# Patient Record
Sex: Male | Born: 1960 | Race: White | Hispanic: No | Marital: Single | Smoking: Never smoker
Health system: Southern US, Community
[De-identification: ages and names within clinical notes are randomized; demographics above are authoritative.]

## PROBLEM LIST (undated history)

## (undated) DIAGNOSIS — E785 Hyperlipidemia, unspecified: Secondary | ICD-10-CM

## (undated) DIAGNOSIS — T7840XA Allergy, unspecified, initial encounter: Secondary | ICD-10-CM

## (undated) DIAGNOSIS — R519 Headache, unspecified: Secondary | ICD-10-CM

## (undated) DIAGNOSIS — K219 Gastro-esophageal reflux disease without esophagitis: Secondary | ICD-10-CM

## (undated) DIAGNOSIS — R51 Headache: Secondary | ICD-10-CM

## (undated) DIAGNOSIS — G8929 Other chronic pain: Secondary | ICD-10-CM

## (undated) HISTORY — PX: APPENDECTOMY: SHX54

## (undated) HISTORY — DX: Headache, unspecified: R51.9

## (undated) HISTORY — DX: Hyperlipidemia, unspecified: E78.5

## (undated) HISTORY — DX: Gastro-esophageal reflux disease without esophagitis: K21.9

## (undated) HISTORY — DX: Headache: R51

## (undated) HISTORY — PX: FOOT SURGERY: SHX648

## (undated) HISTORY — DX: Other chronic pain: G89.29

## (undated) HISTORY — PX: ROTATOR CUFF REPAIR: SHX139

## (undated) HISTORY — DX: Allergy, unspecified, initial encounter: T78.40XA

## (undated) HISTORY — PX: OTHER SURGICAL HISTORY: SHX169

---

## 1997-08-29 ENCOUNTER — Other Ambulatory Visit: Admission: RE | Admit: 1997-08-29 | Discharge: 1997-08-29 | Payer: Self-pay | Admitting: Dermatology

## 2009-03-30 ENCOUNTER — Ambulatory Visit: Payer: Self-pay | Admitting: Internal Medicine

## 2009-03-30 DIAGNOSIS — J309 Allergic rhinitis, unspecified: Secondary | ICD-10-CM | POA: Insufficient documentation

## 2009-03-30 DIAGNOSIS — J45909 Unspecified asthma, uncomplicated: Secondary | ICD-10-CM | POA: Insufficient documentation

## 2009-03-30 LAB — CONVERTED CEMR LAB
Basophils Absolute: 0.1 10*3/uL (ref 0.0–0.1)
Eosinophils Absolute: 0.3 10*3/uL (ref 0.0–0.7)
HCT: 45.9 % (ref 39.0–52.0)
Hemoglobin: 15.4 g/dL (ref 13.0–17.0)
Lymphocytes Relative: 31.7 % (ref 12.0–46.0)
Lymphs Abs: 2.2 10*3/uL (ref 0.7–4.0)
MCHC: 33.6 g/dL (ref 30.0–36.0)
Monocytes Relative: 6.9 % (ref 3.0–12.0)
Neutro Abs: 3.9 10*3/uL (ref 1.4–7.7)
Platelets: 286 10*3/uL (ref 150.0–400.0)
RDW: 12.4 % (ref 11.5–14.6)

## 2009-04-06 DIAGNOSIS — E785 Hyperlipidemia, unspecified: Secondary | ICD-10-CM

## 2009-04-13 LAB — CONVERTED CEMR LAB: IgE (Immunoglobulin E), Serum: 11.2 intl units/mL (ref 0.0–180.0)

## 2009-04-25 ENCOUNTER — Ambulatory Visit: Payer: Self-pay | Admitting: Internal Medicine

## 2009-05-07 ENCOUNTER — Telehealth: Payer: Self-pay | Admitting: Internal Medicine

## 2009-05-16 ENCOUNTER — Ambulatory Visit: Payer: Self-pay | Admitting: Internal Medicine

## 2009-05-18 ENCOUNTER — Ambulatory Visit: Payer: Self-pay | Admitting: Internal Medicine

## 2009-05-18 ENCOUNTER — Encounter: Payer: Self-pay | Admitting: Internal Medicine

## 2009-06-13 ENCOUNTER — Ambulatory Visit: Payer: Self-pay | Admitting: Internal Medicine

## 2009-07-10 ENCOUNTER — Ambulatory Visit: Payer: Self-pay | Admitting: Internal Medicine

## 2009-10-15 ENCOUNTER — Ambulatory Visit: Payer: Self-pay | Admitting: Internal Medicine

## 2009-11-12 ENCOUNTER — Ambulatory Visit: Payer: Self-pay | Admitting: Internal Medicine

## 2010-01-24 ENCOUNTER — Ambulatory Visit: Payer: Self-pay | Admitting: Internal Medicine

## 2010-02-11 ENCOUNTER — Ambulatory Visit: Payer: Self-pay | Admitting: Internal Medicine

## 2010-04-23 NOTE — Assessment & Plan Note (Signed)
Summary: ALT - OK PER KATIE///KP   Vital Signs:  Patient profile:   50 year old male Height:      72 inches Weight:      213 pounds BMI:     28.99 O2 Sat:      99 % on Room air Pulse rate:   73 / minute BP sitting:   130 / 84  (right arm) Cuff size:   regular  Vitals Entered By: Gweneth Dimitri RN (April 25, 2009 3:54 PM)  O2 Flow:  Room air CC: Allergy Testing. Comments Medications reviewed with patient Daytime contact number verified with patient. Gweneth Dimitri RN  April 25, 2009 3:54 PM    Primary Provider/Referring Provider:  Melene Muller  CC:  Allergy Testing.Marland Kitchen  History of Present Illness: History of Present Illness: March 30, 2009- 48 yoM seen at kind request of Dr Felipa Eth with complaint of shortness of breath, especially in the last 3 months, starting in October. He had asthma until age 64, and he says it resolved for years after taking allergy shots. I saw him 15-16 years ago. He states he was athletic and had no issues then. This October is started up again without obvious cold ortriggering event. He notes chest tightness while sitting in chair. Symbicort helps. In Spring and Fall he gets rhinitis. No recent  hx of seasonal wheeze or GERD. House, no mold or smokers. Has [place] pigs- odor takes his breath if cleaning cage.  April 25, 2009- Dyspnea Asthma, allergic rhinitis Had a cold 2 weeks ago that resolved. Since then he has been off antihistamines for allergy testing today. He has felt Singulair helped only a little.  He had been out of town. Between that, placing air cleaner, and moving [place] pigs to area of house he can avoid, he is dong better.  His wife was a Engineer, civil (consulting) and gave his shots in past with no problem.. He has epipen. He lives in [place]. He is leaning toward restarting shots.  RAST was pos for dust mite and [place] pig Skin test-    Current Medications (verified): 1)  Zocor 20 Mg Tabs (Simvastatin) .... Take 1 By Mouth Once Daily 2)  Chantix 0.5  Mg Tabs (Varenicline Tartrate) .... Take As Directed 3)  Multivitamins  Tabs (Multiple Vitamin) .... Take 1 By Mouth Once Daily 4)  Advil 200 Mg Tabs (Ibuprofen) .... Take As Directed 5)  Symbicort 160-4.5 Mcg/act Aero (Budesonide-Formoterol Fumarate) .... 2 Puffs and Rinse Two Times A Day 6)  Proair Hfa 108 (90 Base) Mcg/act Aers (Albuterol Sulfate) .... 2 Puffs 4 X Daily As Needed Rescue 7)  Singulair 10 Mg Tabs (Montelukast Sodium) .Marland Kitchen.. 1 Daily  Allergies (verified): No Known Drug Allergies  Past History:  Past Medical History: Last updated: 03/30/2009 Asthma Chronic headaches Hyperlipidemia  Past Surgical History: Last updated: 03/30/2009 Appendectomy Torn ACL knee Rotator cuff repair.  Social History: Last updated: 03/30/2009 Dipped Tobacco x 30 year 'off and on". Married with 2 children. No ETOH or street drug use.  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    Physical Exam  Additional Exam:  General: A/Ox3; pleasant and cooperative, NAD, muscular SKIN: no rash, lesions NODES: no lymphadenopathy HEENT: [place]/AT, EOM- WNL, Conjuctivae-  clear, PERRLA, TM-WNL, Nose- SNIFFING, MILD CONGESTIONr, Throat- clear and wnl, Mellampatti  II NECK: Supple w/ fair ROM, JVD- none, normal carotid impulses w/o bruits Thyroid-  CHEST: clear to P&A HEART: RRR, no m/g/r heard ABDOMEN: Soft and nl; JXB:JYNW, nl pulses, no edema  NEURO: Grossly intact to observation      Impression & Recommendations:  Problem # 1:  ALLERGIC RHINITIS (ICD-477.9) Atopic, with postiive markers for dust mite/ cockroach, molds. RAST also for [place] pig. We have emphasized environmental control measures again. Chronic rhinitis  improved with allergy shots years ago, starting then with Dr Rema Jasmine. He feels strongly that he wants to restart allergy vaccine. He hopes to avoid "lots of pills", We have discussed policy, standards, safety, anaphyllaxis, Epipen, antinhistamines and environmnetal control measures. His wife is a Engineer, civil (consulting), can be with him to give his shots. He has no doctor in [place] and voices hardship at coming here to [place] to get shots. I asked him to talk this over with his wife.  Problem # 2:  ASTHMA (ICD-493.90) good control currently.  Medications Added to Medication List This Visit: 1)  Epipen 0.3 Mg/0.50ml Devi (Epinephrine) .... For severe allergic reaction  Other Orders: Est. Patient Level II (29562) Allergy Puncture Test (13086) Allergy I.D Test (57846)  Patient Instructions: 1)  Please schedule a follow-up appointment in 2 months. 2)  Let us kinow what you you decide to do about allergy vaccine- discuss with your wife. I understand you have epipen. 3)  Avoid the [place] pigs 4)  consider claritin/ loratadine as needed as an antihistamine. Prescriptions: EPIPEN 0.3 MG/0.3ML DEVI (EPINEPHRINE) For severe allergic reaction  #1 x prn   Entered and Authorized by:   Waymon Budge MD   Signed by:   Waymon Budge MD on 04/25/2009   Method used:   Print then Give to Patient   RxID:   [id number]    Immunization History:  Influenza Immunization History:    Influenza:  historical (12/22/2008)

## 2010-04-23 NOTE — Miscellaneous (Signed)
Summary: Occupational psychologist   Imported By: Lester Franklin 05/02/2009 09:13:26  _____________________________________________________________________  External Attachment:    Type:   Image     Comment:   External Document

## 2010-04-23 NOTE — Assessment & Plan Note (Signed)
Summary: full pulm. wval/childhood asthama & bronchitis/apc   Primary Provider/Referring Provider:  Melene Muller  CC:  Pulmonary Consult-Dr. Felipa Eth.  History of Present Illness: March 30, 2009- 48 yoM seen at kind request of Dr Felipa Eth with complaint of shortness of breath, especially in the last 3 months, starting in October. He had asthma until age 50, and he says it resolved for years after taking allergy shots. I saw him 15-16 years ago. He states he was athletic and had no issues then. This October is started up again without obvious cold ortriggering event. He nots chest tightness 2hile sitting in chair. Symbicort helps. In Spring and Fall he gets rhinitis. No hx of seasonal rhinitis or GERD. House, no mold or smokers. Has [place] pigs- odor takes his breath if cleaning cage.  Current Medications (verified): 1)  Zocor 20 Mg Tabs (Simvastatin) .... Take 1 By Mouth Once Daily 2)  Chantix 0.5 Mg Tabs (Varenicline Tartrate) .... Take As Directed 3)  Multivitamins  Tabs (Multiple Vitamin) .... Take 1 By Mouth Once Daily 4)  Advil 200 Mg Tabs (Ibuprofen) .... Take As Directed  Allergies (verified): No Known Drug Allergies  Past History:  Past Medical History: Asthma Chronic headaches Hyperlipidemia  Past Surgical History: Appendectomy Torn ACL knee Rotator cuff repair.  Social History: Dipped Tobacco x 30 year 'off and on". Married with 2 children. No ETOH or street drug use.  Solicitor at a Therapist, music  Review of Systems      See HPI       The patient complains of shortness of breath with activity, shortness of breath at rest, and non-productive cough.    Vital Signs:  Patient profile:   50 year old male Height:      72 inches Weight:      210.25 pounds BMI:     28.62 O2 Sat:      98 % on Room air Pulse rate:   70 / minute BP sitting:   124 / 80  (left arm) Cuff size:   regular  Vitals Entered By: Reynaldo Minium CMA (March 30, 2009 2:39 PM)  O2 Flow:  Room  air  Physical Exam  Additional Exam:  General: A/Ox3; pleasant and cooperative, NAD, muscular SKIN: no rash, lesions NODES: no lymphadenopathy HEENT: [place]/AT, EOM- WNL, Conjuctivae- clear, PERRLA, TM-WNL, Nose- clear, Throat- clear and wnl, Mellampatti  II NECK: Supple w/ fair ROM, JVD- none, normal carotid impulses w/o bruits Thyroid- normal to palpation CHEST: dry cough/ mild wheeze HEART: RRR, no m/g/r heard ABDOMEN: Soft and nl; ZSW:FUXN, nl pulses, no edema  NEURO: Grossly intact to observation      Pulmonary Function Test Date: 03/30/2009 3:35 PM Gender: Male  Pre-Spirometry FVC    Value: 4.00 L/min   % Pred: 73.50 % FEV1    Value: 2.87 L     Pred: 4.26 L     % Pred: 67.30 % FEV1/FVC  Value: 71.75 %     % Pred: 91.30 %  Impression & Recommendations:  Problem # 1:  ALLERGIC RHINITIS (ICD-477.9) Mild/ minimal symptoms now of significant rhinosiusitis.  Problem # 2:  ASTHMA (ICD-493.90) He did well for several years after allergy vaccine and is interested in looking that way again. We discussed environmental factors and suggested an air cleaner near his [place] pigs if he can't get rid of them. We will try adding samples of singulair and a rescue inhaler. We will add spirometry.  Medications Added to Medication List  This Visit: 1)  Zocor 20 Mg Tabs (Simvastatin) .... Take 1 by mouth once daily 2)  Chantix 0.5 Mg Tabs (Varenicline tartrate) .... Take as directed 3)  Multivitamins Tabs (Multiple vitamin) .... Take 1 by mouth once daily 4)  Advil 200 Mg Tabs (Ibuprofen) .... Take as directed 5)  Simcor 500-20 Mg Xr24h-tab (Niacin-simvastatin) .... Take 1 by mouth once daily 6)  Symbicort 160-4.5 Mcg/act Aero (Budesonide-formoterol fumarate) .... 2 puffs and rinse two times a day 7)  Proair Hfa 108 (90 Base) Mcg/act Aers (Albuterol sulfate) .... 2 puffs 4 x daily as needed rescue 8)  Singulair 10 Mg Tabs (Montelukast sodium) .Marland Kitchen.. 1 daily  Other Orders: Consultation Level  III (36644) TLB-CBC Platelet - w/Differential (85025-CBCD) T-"RAST" (Allergy Full Profile) IGE ([id number]) T- * Misc. Laboratory test [phone])  Patient Instructions: 1)  Return as able for allergy skin testing- Stop all antihistamines 3 days before skin testing, including cold and allergy meds, otc sleep and cough meds. 2)  Sample and script Singulair- 1 daily 3)  Sample and script Proair rescue inhaler- 2 puffs four times a day as needed 4)  Lab 5)  conisder dust control measures/ air cleaner 6)  Office spirometry Prescriptions: SYMBICORT 160-4.5 MCG/ACT AERO (BUDESONIDE-FORMOTEROL FUMARATE) 2 puffs and rinse two times a day  #1 x prn   Entered and Authorized by:   Waymon Budge MD   Signed by:   Waymon Budge MD on 03/30/2009   Method used:   Historical   RxID:   [id number] SINGULAIR 10 MG TABS (MONTELUKAST SODIUM) 1 daily  #30 x prn   Entered and Authorized by:   Waymon Budge MD   Signed by:   Waymon Budge MD on 03/30/2009   Method used:   Print then Give to Patient   RxID:   [id number] PROAIR HFA 108 (90 BASE) MCG/ACT AERS (ALBUTEROL SULFATE) 2 puffs 4 x daily as needed rescue  #1 x prn   Entered and Authorized by:   Waymon Budge MD   Signed by:   Waymon Budge MD on 03/30/2009   Method used:   Print then Give to Patient   RxID:   [id number]      CardioPerfect Spirometry  ID: [id number] Patient: David Mercado, David Mercado DOB: June 29, 1960 Age: 50 Years Old Sex: Male Race: White Physician: Arthurine Oleary,c Height: 72 Weight: 210.25 Status: Unconfirmed Recorded: 03/30/2009 3:35 PM  Parameter  Measured Predicted %Predicted FVC     4.00        5.44        73.50 FEV1     2.87        4.26        67.30 FEV1%   71.75        78.56        91.30 PEF    4.43        10.37        42.70   Interpretation:

## 2010-04-23 NOTE — Progress Notes (Signed)
Summary: allergy shots  Phone Note Call from Patient Call back at Home Phone [phone]   Caller: Patient Call For: Gilford Lardizabal Reason for Call: Talk to Nurse Summary of Call: 2 weeks since ALT - would like a call back as to when he starts his shots.      Initial call taken by: Eugene Gavia,  May 07, 2009 4:44 PM  Follow-up for Phone Call        pt has decided to start allergy vaccine. please advise. Carron Curie CMA  May 07, 2009 5:10 PM   Additional Follow-up for Phone Call Additional follow up Details #1::        We can start allergy vaccine Additional Follow-up by: Waymon Budge MD,  May 09, 2009 3:42 PM    Additional Follow-up for Phone Call Additional follow up Details #2::    We ordered the [place] pig extract yesterday. If it doesn't come in Fri. it will be Mon. before we can make up his vac.. We will call Mr.Earnhardt when it's ready. Follow-up by: Dimas Millin,  May 10, 2009 10:17 AM  Additional Follow-up for Phone Call Additional follow up Details #3:: Details for Additional Follow-up Action Taken: We need a rx for Mr. Mccurdy for his vac. Yes,the [place] pig came in.    I found the rx. Sorry to bother you.  New/Updated Medications: * ALLERGY VACCINE NEW START GO

## 2010-04-23 NOTE — Assessment & Plan Note (Signed)
Summary: 3 months/apc   Primary Provider/Referring Provider:  Melene Muller  CC:  3 month follow up and less asthma symptons states dulera inhaler has helped.  History of Present Illness:  April 25, 2009- Dyspnea Asthma, allergic rhinitis Had a cold 2 weeks ago that resolved. Since then he has been off antihistamines for allergy testing today. He has felt Singulair helped only a little.  He had been out of town. Between that, placing air cleaner, and moving [place] pigs to area of house he can avoid, he is dong better.  His wife was a Engineer, civil (consulting) and gave his shots in past with no problem.. He has epipen. He lives in [place]. He is leaning toward restarting shots.  RAST was pos for dust mite and [place] pig Skin test-   November 12, 2009- Dyspnea, Asthma, Allergic rhinitis Hard summer, needing daily use of inhalers. We discussed humidity and air quality, not allergy, as summer-time irritants .Has been building allergy vaccine with no problems. This weekend started sneeze and head congested. Taking Vicks or Theraflu to sleep. Cautioned about nasal decongestants. Symbicort didn't seem to help. He has been using rescue inhaler at least daily. Has been walking outdoors several miles daily and we talked about air quality and exposure.  February 11, 2010-  Dyspnea, Asthma, Allergic rhinitis Nurse-CC: 3 month follow up, less asthma symptons states dulera inhaler has helped.  He is pleased with Dulera 100, finding he doesn't need to take the rescue inhaler. Still has the [place] pigs. He was good till he got here and started sniffing.     Asthma History    Asthma Control Assessment:    Age range: 12+ years    Symptoms: 0-2 days/week    Nighttime Awakenings: 0-2/month    Interferes w/ normal activity: no limitations    SABA use (not for EIB): 0-2 days/week    FEV1: 2.87 liters (today)    FEV1 Pred: 4.26 liters (today)    Asthma Control Assessment: Not Well Controlled   Preventive  Screening-Counseling & Management  Alcohol-Tobacco     Smoking Status: never  Current Medications (verified): 1)  Zocor 20 Mg Tabs (Simvastatin) .... Take 1 By Mouth Once Daily 2)  Multivitamins  Tabs (Multiple Vitamin) .... Take 1 By Mouth Once Daily 3)  Advil 200 Mg Tabs (Ibuprofen) .... Take As Directed 4)  Proair Hfa 108 (90 Base) Mcg/act Aers (Albuterol Sulfate) .... 2 Puffs 4 X Daily As Needed Rescue 5)  Epipen 0.3 Mg/0.47ml Devi (Epinephrine) .... For Severe Allergic Reaction 6)  Allergy Vaccine 1:10 Go .... Next Order 7)  Dulera 100-5 Mcg/act Aero (Mometasone Furo-Formoterol Fum) .... 2 Puffs and Rinse Mouth, Two Times A Day  Allergies (verified): No Known Drug Allergies  Past History:  Past Medical History: Last updated: 03/30/2009 Asthma Chronic headaches Hyperlipidemia  Past Surgical History: Last updated: 03/30/2009 Appendectomy Torn ACL knee Rotator cuff repair.  Family History: Last updated: 11/12/2009 Parents living- no allergy or respiratory problems  Social History: Last updated: 03/30/2009 Dipped Tobacco x 30 year 'off and on". Married with 2 children. No ETOH or street drug use.  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  The patient denies shortness of breath with activity, shortness of breath at rest, productive cough, non-productive cough, coughing up blood, chest pain, irregular heartbeats, acid  heartburn, indigestion, loss of appetite, weight change, abdominal pain, difficulty swallowing, sore throat, tooth/dental problems, headaches, and sneezing.    Vital Signs:  Patient profile:   50 year old male Height:      72 inches Weight:      195.8 pounds BMI:     26.65 O2 Sat:      96 % on Room air Pulse rate:   78 / minute BP sitting:   130 / 78  (left arm)  Vitals Entered By: Renold Genta RCP, LPN (February 11, 2010 2:58 PM)  O2 Flow:  Room air CC: 3 month follow up, less asthma symptons states dulera inhaler has helped Comments Medications reviewed with patient Renold Genta RCP, LPN  February 11, 2010 3:02 PM    Physical Exam  Additional Exam:  General: A/Ox3; pleasant and cooperative, NAD, muscular SKIN: no rash, lesions NODES: no lymphadenopathy HEENT: /AT, EOM- WNL, Conjuctivae- clear, PERRLA, TM-WNL, Nose- SNIFFING repetedly. Prominent turbinates, normal mucosa. Throat- clear and wnl, Mallampati  II NECK: Supple w/ fair ROM, JVD- none, normal carotid impulses w/o bruits Thyroid-  CHEST: clear to P&A HEART: RRR, no m/g/r heard ABDOMEN: flat FAO:ZHYQ, nl pulses, no edema  NEURO: Grossly intact to observation      Pre-Spirometry FEV1    Value: 2.87 L     Pred: 4.26 L     Impression & Recommendations:  Problem # 1:  ALLERGIC RHINITIS (ICD-477.9)  Increased sniffing as he came here today. We don't recognize an exposure problem here so i suspect he reacted to the outside air leaving his car. Discussed antihistamines.  Problem # 2:  ASTHMA (ICD-493.90) Slight wheeze. We had discussed ability to reduce Dulera  a little from 2 puffs two times a day if he felt very stable,  but he may do better to stay where he is.   Other Orders: Est. Patient Level III (65784)  Patient Instructions: 1)  Please schedule a follow-up appointment in 6 months. Please call sooner if needed.

## 2010-04-23 NOTE — Miscellaneous (Signed)
Summary: Injection Record/[facility]  Injection Record/[facility]   Imported By: Lanelle Bal 07/25/2009 14:18:25  _____________________________________________________________________  External Attachment:    Type:   Image     Comment:   External Document

## 2010-04-23 NOTE — Assessment & Plan Note (Signed)
Summary: allergy shot - ok per SF//kp  Nurse Visit   Allergies: No Known Drug Allergies  Orders Added: 1)  No Charge Patient Arrived (NCPA0) [NCPA0]

## 2010-04-23 NOTE — Assessment & Plan Note (Signed)
Summary: f/u asthma//kp   Primary Jerae Izard/Referring Delton Stelle:  Melene Muller  CC:  followup, having to use inhalers everyday "hard summer" wheezing sob with exertion ans rest, and non productive cough when sob.  History of Present Illness:  History of Present Illness: History of Present Illness: March 30, 2009- 50 yoM seen at kind request of Dr Felipa Eth with complaint of shortness of breath, especially in the last 3 months, starting in October. He had asthma until age 37, and he says it resolved for years after taking allergy shots. I saw him 15-16 years ago. He states he was athletic and had no issues then. This October is started up again without obvious cold ortriggering event. He notes chest tightness while sitting in chair. Symbicort helps. In Spring and Fall he gets rhinitis. No recent  hx of seasonal wheeze or GERD. House, no mold or smokers. Has [place] pigs- odor takes his breath if cleaning cage.  April 25, 2009- Dyspnea Asthma, allergic rhinitis Had a cold 2 weeks ago that resolved. Since then he has been off antihistamines for allergy testing today. He has felt Singulair helped only a little.  He had been out of town. Between that, placing air cleaner, and moving [place] pigs to area of house he can avoid, he is dong better.  His wife was a Engineer, civil (consulting) and gave his shots in past with no problem.. He has epipen. He lives in [place]. He is leaning toward restarting shots.  RAST was pos for dust mite and [place] pig Skin test-   November 12, 2009- Dyspnea, Asthma, Allergic rhinitis Hard summer, needing daily use of inhalers. We discussed humidity and air quality, not allergy, as summer-time irritants .Has been building allergy vaccine with no problems. This weekend started sneeze and head congested. Taking Vicks or Theraflu to sleep. Cautioned about nasal decongestants. Symbicort didn't seem to help. He has been using rescue inhaler at least daily. Has been walking outdoors several miles daily  and we talked about air quality and exposure.    Asthma History    Initial Asthma Severity Rating:    Age range: 12+ years    Symptoms: >2 days/week; not daily    Nighttime Awakenings: 0-2/month    Interferes w/ normal activity: no limitations    SABA use (not for EIB): daily    Asthma Severity Assessment: Moderate Persistent   Preventive Screening-Counseling & Management  Alcohol-Tobacco     Smoking Status: never  Current Medications (verified): 1)  Zocor 20 Mg Tabs (Simvastatin) .... Take 1 By Mouth Once Daily 2)  Multivitamins  Tabs (Multiple Vitamin) .... Take 1 By Mouth Once Daily 3)  Advil 200 Mg Tabs (Ibuprofen) .... Take As Directed 4)  Proair Hfa 108 (90 Base) Mcg/act Aers (Albuterol Sulfate) .... 2 Puffs 4 X Daily As Needed Rescue 5)  Epipen 0.3 Mg/0.25ml Devi (Epinephrine) .... For Severe Allergic Reaction 6)  Allergy Vaccine New Start Go  Allergies: No Known Drug Allergies  Past History:  Past Medical History: Last updated: 03/30/2009 Asthma Chronic headaches Hyperlipidemia  Past Surgical History: Last updated: 03/30/2009 Appendectomy Torn ACL knee Rotator cuff repair.  Family History: Last updated: 11/12/2009 Parents living- no allergy or respiratory problems  Social History: Last updated: 03/30/2009 Dipped Tobacco x 30 year 'off and on". Married with 2 children. No ETOH or street drug use.  Solicitor at a box [employer]  Risk Factors: Smoking Status: never (11/12/2009)  Family History: Parents living- no allergy or respiratory problems  Social History:  Smoking Status:  never  Review of Systems      See HPI       The patient complains of nasal congestion/difficulty breathing through nose and sneezing.  The patient denies shortness of breath with activity, shortness of breath at rest, productive cough, non-productive cough, coughing up blood, chest pain, irregular heartbeats, acid heartburn, indigestion, loss of appetite, weight change,  abdominal pain, difficulty swallowing, sore throat, tooth/dental problems, and headaches.    Vital Signs:  Patient profile:   50 year old male Height:      72 inches Weight:      194.13 pounds BMI:     26.42 O2 Sat:      98 % on Room air BP sitting:   140 / 70  (left arm) Cuff size:   regular  Vitals Entered By: Kandice Hams CMA (November 12, 2009 2:57 PM)  O2 Flow:  Room air CC: followup, having to use inhalers everyday "hard summer" wheezing sob with exertion ans rest, non productive cough when sob Comments pt stopped singulair and symbicort per pt did not seem to help.   Physical Exam  Additional Exam:  General: A/Ox3; pleasant and cooperative, NAD, muscular SKIN: no rash, lesions NODES: no lymphadenopathy HEENT: [place]/AT, EOM- WNL, Conjuctivae- clear, PERRLA, TM-WNL, Nose- SNIFFING repetedly. Prominent turbinates, normal mucosa. Throat- clear and wnl, Mallampati  II NECK: Supple w/ fair ROM, JVD- none, normal carotid impulses w/o bruits Thyroid-  CHEST: clear to P&A HEART: RRR, no m/g/r heard ABDOMEN: flat WGN:FAOZ, nl pulses, no edema  NEURO: Grossly intact to observation      Impression & Recommendations:  Problem # 1:  ALLERGIC RHINITIS (ICD-477.9)  We wil have him try again with a nasal steroid. Advancing allergy vaccine to 1:10. Also try nasal strips at night, Mucosa doesn't ;ook bad and he may eventually want to talk to an ENT surgeon about mechanical airway improvement.  Problem # 2:  ASTHMA (ICD-493.90) He didn't find Symbicort helpfu.l I think if his nose worked better he would feel less dyspneic, but I don't find any process other than asthma and rhinitis  to give dyspnea. We will try [facility].  Medications Added to Medication List This Visit: 1)  Allergy Vaccine 1:10 Go  .... Next order 2)  Dulera 100-5 Mcg/act Aero (Mometasone furo-formoterol fum) .... 2 puffs and rinse mouth, two times a day  Other Orders: Est. Patient Level IV (30865)  Patient  Instructions: 1)  We will have the allergy lab increase your vaccine to the 1:10 strength next time you order. 2)  You can go up to twice weekly with your current vaccine. 3)  Try Breathe right Nasal strips to open your nose at night 4)  Try Dulera 100-5, sample and script, as a maintenance inhaler 5)  2 puffs and rinse mouth, twice every day 6)  Sample Veramyst, 1-2 puffs each nostril once daily at bedtime. You can use it twice daily if needed. Call for script if helpful. Prescriptions: DULERA 100-5 MCG/ACT AERO (MOMETASONE FURO-FORMOTEROL FUM) 2 puffs and rinse mouth, two times a day  #1 x prn   Entered and Authorized by:   Waymon Budge MD   Signed by:   Waymon Budge MD on 11/12/2009   Method used:   Print then Give to Patient   RxID:   [id number]

## 2010-04-23 NOTE — Miscellaneous (Signed)
Summary: Injection Orders / [facility]    Injection Orders / [facility]    Imported By: Lennie Odor 08/21/2009 14:30:45  _____________________________________________________________________  External Attachment:    Type:   Image     Comment:   External Document

## 2010-07-24 ENCOUNTER — Other Ambulatory Visit: Payer: Self-pay | Admitting: Internal Medicine

## 2010-07-26 ENCOUNTER — Telehealth: Payer: Self-pay | Admitting: Internal Medicine

## 2010-07-26 NOTE — Telephone Encounter (Signed)
Spoke with pharmacist and advised okay to refill allergy syringes #100 with 1 refill and advised to have pt keep his appt for future refills.

## 2010-08-06 ENCOUNTER — Ambulatory Visit (INDEPENDENT_AMBULATORY_CARE_PROVIDER_SITE_OTHER): Payer: Self-pay

## 2010-08-06 DIAGNOSIS — J309 Allergic rhinitis, unspecified: Secondary | ICD-10-CM

## 2010-08-12 ENCOUNTER — Encounter: Payer: Self-pay | Admitting: Internal Medicine

## 2010-08-12 ENCOUNTER — Ambulatory Visit (INDEPENDENT_AMBULATORY_CARE_PROVIDER_SITE_OTHER): Payer: 59 | Admitting: Internal Medicine

## 2010-08-12 VITALS — BP 114/70 | HR 65 | Ht 72.0 in | Wt 200.2 lb

## 2010-08-12 DIAGNOSIS — F17200 Nicotine dependence, unspecified, uncomplicated: Secondary | ICD-10-CM

## 2010-08-12 DIAGNOSIS — Z720 Tobacco use: Secondary | ICD-10-CM | POA: Insufficient documentation

## 2010-08-12 DIAGNOSIS — J309 Allergic rhinitis, unspecified: Secondary | ICD-10-CM

## 2010-08-12 DIAGNOSIS — J45909 Unspecified asthma, uncomplicated: Secondary | ICD-10-CM

## 2010-08-12 MED ORDER — VARENICLINE TARTRATE 0.5 MG PO TABS: ORAL_TABLET | ORAL | Status: AC

## 2010-08-12 MED ORDER — EPINEPHRINE 0.3 MG/0.3ML IJ DEVI: 0.3000 mg | Freq: Once | INTRAMUSCULAR | Status: AC

## 2010-08-12 MED ORDER — VARENICLINE TARTRATE 1 MG PO TABS: ORAL_TABLET | ORAL | Status: AC

## 2010-08-12 NOTE — Assessment & Plan Note (Addendum)
Snuff dipper. We discussed and will restart Chantix. We discussed especially the concerns related to side effects including mood/ depression.

## 2010-08-12 NOTE — Progress Notes (Signed)
  Subjective:    Patient ID: David Mercado, male    DOB: 09-06-1960, 50 y.o.   MRN: [id number]  HPI 08/11/10- 50 yoM followed for allergic rhinitis and asthma. Last here February 11, 2010. Reports no special problems since last here. His wife still gives his allergy shots which he thinks do help. Still has the [place] pigs to which he is allergic- now has air cleaners for that area with benefit. He denies need for changes. He uses Dulera for a few days at a time if at all tight, with no need for his rescue inhaler.  He asks to restart Chantix which helped in the past to stop dipping. Well tolerated before with a few dreams. He has pamphlet.    Review of Systems Constitutional:   No weight loss, night sweats,  Fevers, chills, fatigue, lassitude. HEENT:   No headaches,  Difficulty swallowing,  Tooth/dental problems,  Sore throat,                No sneezing, itching, ear ache, nasal congestion, post nasal drip,   CV:  No chest pain,  Orthopnea, PND, swelling in lower extremities, anasarca, dizziness, palpitations  GI  No heartburn, indigestion, abdominal pain, nausea, vomiting, diarrhea, change in bowel habits, loss of appetite  Resp: No shortness of breath with exertion or at rest.  No excess mucus, no productive cough,  No non-productive cough,  No coughing up of blood.  No change in color of mucus. Occasional mild wheezing per HPI.    Skin: no rash or lesions.  GU: no dysuria, change in color of urine, no urgency or frequency.  No flank pain.  MS:  No joint pain or swelling.  No decreased range of motion.  No back pain.  Psych:  No change in mood or affect. No depression or anxiety.  No memory loss.      Objective:   Physical Exam General- Alert, Oriented, Affect-appropriate, Distress- none acute  Skin- rash-none, lesions- none, excoriation- none  Lymphadenopathy- none  Head- atraumatic  Eyes- Gross vision intact, PERRLA, conjunctivae clear secretions  Ears- Hearing, canals, -  normal , Nose- Mild edema and mucus bridging,    No- Septal dev , polyps, erosion, perforation   Throat- Mallampati II , mucosa clear , drainage- none, tonsils- atrophic  Neck- flexible , trachea midline, no stridor , thyroid nl, carotid no bruit  Chest - symmetrical excursion , unlabored     Heart/CV- RRR , no murmur , no gallop  , no rub, nl s1 s2                     - JVD- none , edema- none, stasis changes- none, varices- none     Lung- clear to P&A, wheeze- none, cough- none , dullness-none, rub- none     Chest wall-   Abd- tender-no, distended-no, bowel sounds-present, HSM- no  Br/ Gen/ Rectal- Not done, not indicated  Extrem- cyanosis- none, clubbing, none, atrophy- none, strength- nl  Neuro- grossly intact to observation         Assessment & Plan:

## 2010-08-12 NOTE — Patient Instructions (Signed)
Continue allergy vaccine  Epipen refill printed  Scripts for Chantix starter and continuity printed.   Please call as needed.

## 2010-08-12 NOTE — Assessment & Plan Note (Signed)
To continue allergy shots. He has not gotten rid of the [place] pigs although he knows he is allergic to them. Discussed and refilled his Epipen.

## 2010-08-12 NOTE — Assessment & Plan Note (Signed)
Good control

## 2010-08-15 ENCOUNTER — Encounter: Payer: Self-pay | Admitting: Internal Medicine

## 2010-12-27 ENCOUNTER — Telehealth: Payer: Self-pay | Admitting: Internal Medicine

## 2010-12-27 MED ORDER — MOMETASONE FURO-FORMOTEROL FUM 100-5 MCG/ACT IN AERO: 2.0000 | INHALATION_SPRAY | Freq: Two times a day (BID) | RESPIRATORY_TRACT | Status: DC

## 2010-12-27 NOTE — Telephone Encounter (Signed)
I spoke with mrs. David Mercado and she states pt needed his dulera rx sent to [facility] for 3 month supply. Rx has been sent

## 2010-12-30 ENCOUNTER — Telehealth: Payer: Self-pay | Admitting: Internal Medicine

## 2010-12-30 NOTE — Telephone Encounter (Signed)
I spoke with pt wife and she states she gave pt his allergy shot last night and he had a severe allergic reaction. She states he is on a maintenance dose but they just received a new bottle on Friday. Pt wife states he was given the allergy vaccine and then 10-15 minutes later he started sweating profusely, was very hot, flush, felt faint. She states she gave pt 2 benadryl and gave him a shot of epinephrine. She states pt was still not completely himself so she called 911. She states while on the phone with them she states pt came to and states he did not want to go to the hospital. Wife states pt looked and felt fine this morning and went to work. She states he did have a little headache but read the epinephrine can cause that. Wife is wanting to know if the new bottle could have caused this or something else. Please advise Dr. Maple Hudson. Thanks  Carver Fila, CMA

## 2010-12-30 NOTE — Telephone Encounter (Signed)
I returned call. Wife put patient on phone. He still feels "washed out" and had come home from work. Arm is still sore at injection site from shot last night. We discussed use of nonsedating antihistamine now, rather than benadryl, if needed. He had pruritus, flushed, hot. Imp- Allergic response to first dose of vaccine from new vial- still at 1:50 as he has been using. Shots were for allergic asthma.  Plan- D/C allergy vaccine now. Call if needed next few days. Otherwise he will keep scheduled appointment as we watch to see over time how he does off shots.

## 2010-12-31 ENCOUNTER — Other Ambulatory Visit: Payer: Self-pay | Admitting: Internal Medicine

## 2012-01-09 ENCOUNTER — Ambulatory Visit
Admission: RE | Admit: 2012-01-09 | Discharge: 2012-01-09 | Disposition: A | Discharge status: Home / Self Care | Payer: 59 | Source: Ambulatory Visit | Attending: Allergy | Admitting: Allergy

## 2012-01-09 ENCOUNTER — Other Ambulatory Visit: Payer: Self-pay | Admitting: Allergy

## 2012-01-09 DIAGNOSIS — J45909 Unspecified asthma, uncomplicated: Secondary | ICD-10-CM

## 2012-10-22 ENCOUNTER — Encounter: Payer: Self-pay | Admitting: Internal Medicine

## 2012-12-14 ENCOUNTER — Ambulatory Visit (AMBULATORY_SURGERY_CENTER): Payer: Self-pay | Admitting: *Deleted

## 2012-12-14 VITALS — Ht 72.0 in | Wt 207.0 lb

## 2012-12-14 DIAGNOSIS — Z1211 Encounter for screening for malignant neoplasm of colon: Secondary | ICD-10-CM

## 2012-12-14 MED ORDER — PEG-KCL-NACL-NASULF-NA ASC-C 100 G PO SOLR: ORAL | Status: DC

## 2012-12-14 NOTE — Progress Notes (Signed)
Pt states he was told by his mother "I had a polyp removed as a child"  No egg or soy allergy

## 2012-12-15 ENCOUNTER — Encounter: Payer: Self-pay | Admitting: Internal Medicine

## 2012-12-28 ENCOUNTER — Encounter: Payer: Self-pay | Admitting: Internal Medicine

## 2012-12-28 ENCOUNTER — Ambulatory Visit (AMBULATORY_SURGERY_CENTER): Payer: 59 | Admitting: Internal Medicine

## 2012-12-28 VITALS — BP 136/74 | HR 51 | Temp 96.3°F | Resp 13 | Ht 72.0 in | Wt 207.0 lb

## 2012-12-28 DIAGNOSIS — D126 Benign neoplasm of colon, unspecified: Secondary | ICD-10-CM

## 2012-12-28 DIAGNOSIS — Z1211 Encounter for screening for malignant neoplasm of colon: Secondary | ICD-10-CM

## 2012-12-28 MED ORDER — SODIUM CHLORIDE 0.9 % IV SOLN: 500.0000 mL | INTRAVENOUS | Status: DC

## 2012-12-28 NOTE — Progress Notes (Signed)
Called to room to assist during endoscopic procedure.  Patient ID and intended procedure confirmed with present staff. Received instructions for my participation in the procedure from the performing physician.  

## 2012-12-28 NOTE — Op Note (Signed)
[facility] [address] [place], [address]   COLONOSCOPY PROCEDURE REPORT  PATIENT: David Mercado, David Mercado  MR#: [id number] BIRTHDATE: 1960/10/24 , 52  yrs. old GENDER: Male ENDOSCOPIST: Roxy Cedar, MD REFERRED XB:[id number] Avva, M.D. PROCEDURE DATE:  12/28/2012 PROCEDURE:   Colonoscopy with snare polypectomy x 1 First Screening Colonoscopy - Avg.  risk and is 50 yrs.  old or older Yes.  Prior Negative Screening - Now for repeat screening. N/A  History of Adenoma - Now for follow-up colonoscopy & has been > or = to 3 yrs.  N/A  Polyps Removed Today? Yes. ASA CLASS:   Class II INDICATIONS:average risk screening. MEDICATIONS: MAC sedation, administered by CRNA and propofol (Diprivan) 300mg  IV  DESCRIPTION OF PROCEDURE:   After the risks benefits and alternatives of the procedure were thoroughly explained, informed consent was obtained.  A digital rectal exam revealed no abnormalities of the rectum.   The LB [id number]  endoscope was introduced through the anus and advanced to the cecum, which was identified by both the appendix and ileocecal valve. No adverse events experienced.   The quality of the prep was excellent, using MoviPrep  The instrument was then slowly withdrawn as the colon was fully examined.   COLON FINDINGS: A diminutive polyp was found at the cecum.  A polypectomy was performed with a cold snare.  The resection was complete and the polyp tissue was completely retrieved.   The colon was otherwise normal.  There was no diverticulosis, inflammation, other polyps or cancers .  Retroflexed views revealed internal hemorrhoids. The time to cecum=2 minutes 50 seconds.  Withdrawal time=11 minutes 37 seconds.  The scope was withdrawn and the procedure completed.  COMPLICATIONS: There were no complications.  ENDOSCOPIC IMPRESSION: 1.   Diminutive polyp was found at the cecum; polypectomy was performed with a cold snare 2.   The colon was  otherwise normal  RECOMMENDATIONS: 1. Repeat colonoscopy in 5 years if polyp adenomatous; otherwise 10 years   eSigned:  Roxy Cedar, MD 12/28/2012 11:59 AM   cc: Chilton Greathouse, MD and The Patient

## 2012-12-28 NOTE — Progress Notes (Signed)
Lidocaine-40mg IV prior to Propofol InductionPropofol given over incremental dosages 

## 2012-12-28 NOTE — Progress Notes (Signed)
Patient did not experience any of the following events: a burn prior to discharge; a fall within the facility; wrong site/side/patient/procedure/implant event; or a hospital transfer or hospital admission upon discharge from the facility. (G8907) Patient did not have preoperative order for IV antibiotic SSI prophylaxis. (G8918)  

## 2012-12-28 NOTE — Patient Instructions (Addendum)

## 2012-12-29 ENCOUNTER — Telehealth: Payer: Self-pay | Admitting: *Deleted

## 2012-12-29 NOTE — Telephone Encounter (Signed)
  Follow up Call-  Call back number 12/28/2012  Post procedure Call Back phone  # [phone]  Permission to leave phone message Yes     Patient questions:  Do you have a fever, pain , or abdominal swelling? no Pain Score  0 *  Have you tolerated food without any problems? yes  Have you been able to return to your normal activities? yes  Do you have any questions about your discharge instructions: Diet   no Medications  no Follow up visit  no  Do you have questions or concerns about your Care? no  Actions: * If pain score is 4 or above: No action needed, pain <4.

## 2013-01-04 ENCOUNTER — Encounter: Payer: Self-pay | Admitting: Internal Medicine

## 2013-04-08 IMAGING — CR DG CHEST 2V
2 series · 2 of 2 positions shown · non-contrast
Comparison: None.

CLINICAL DATA: Asthma.  Wheezing and coughing.

CHEST - 2 VIEW

[view not recorded (1 of 2)]
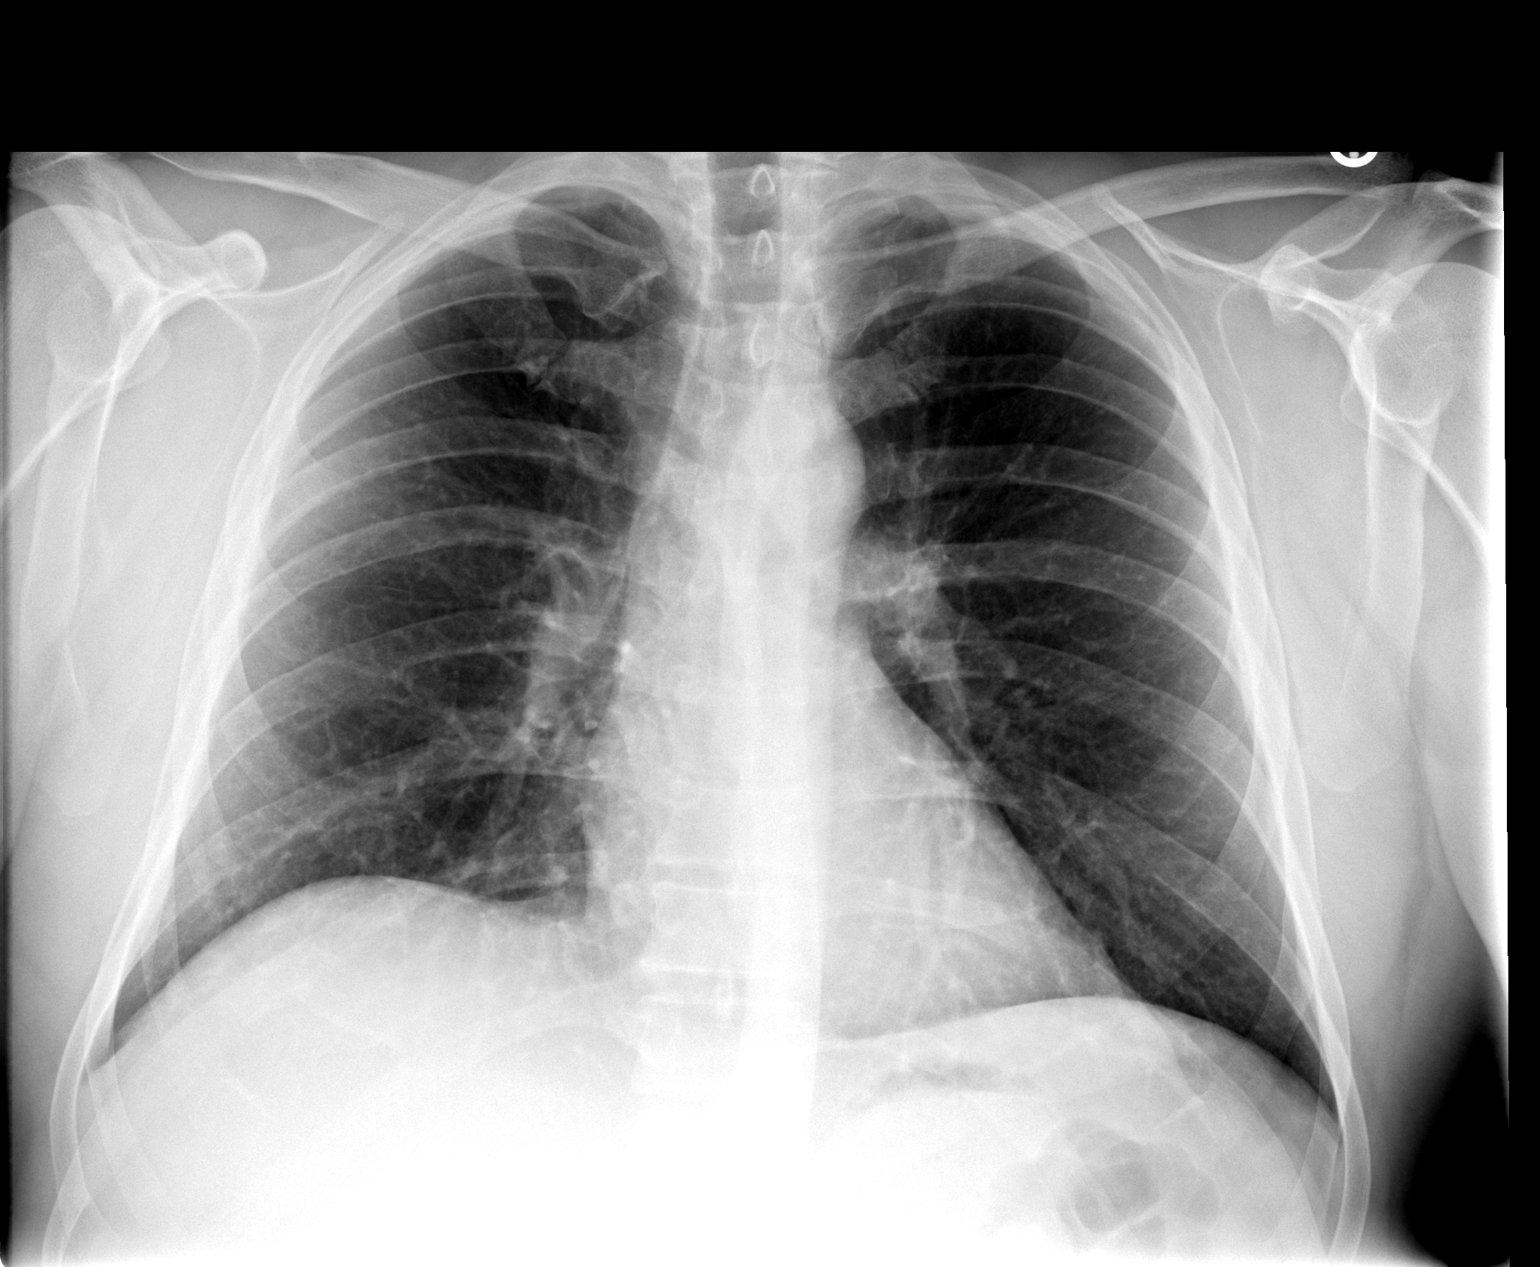

[view not recorded (2 of 2)]
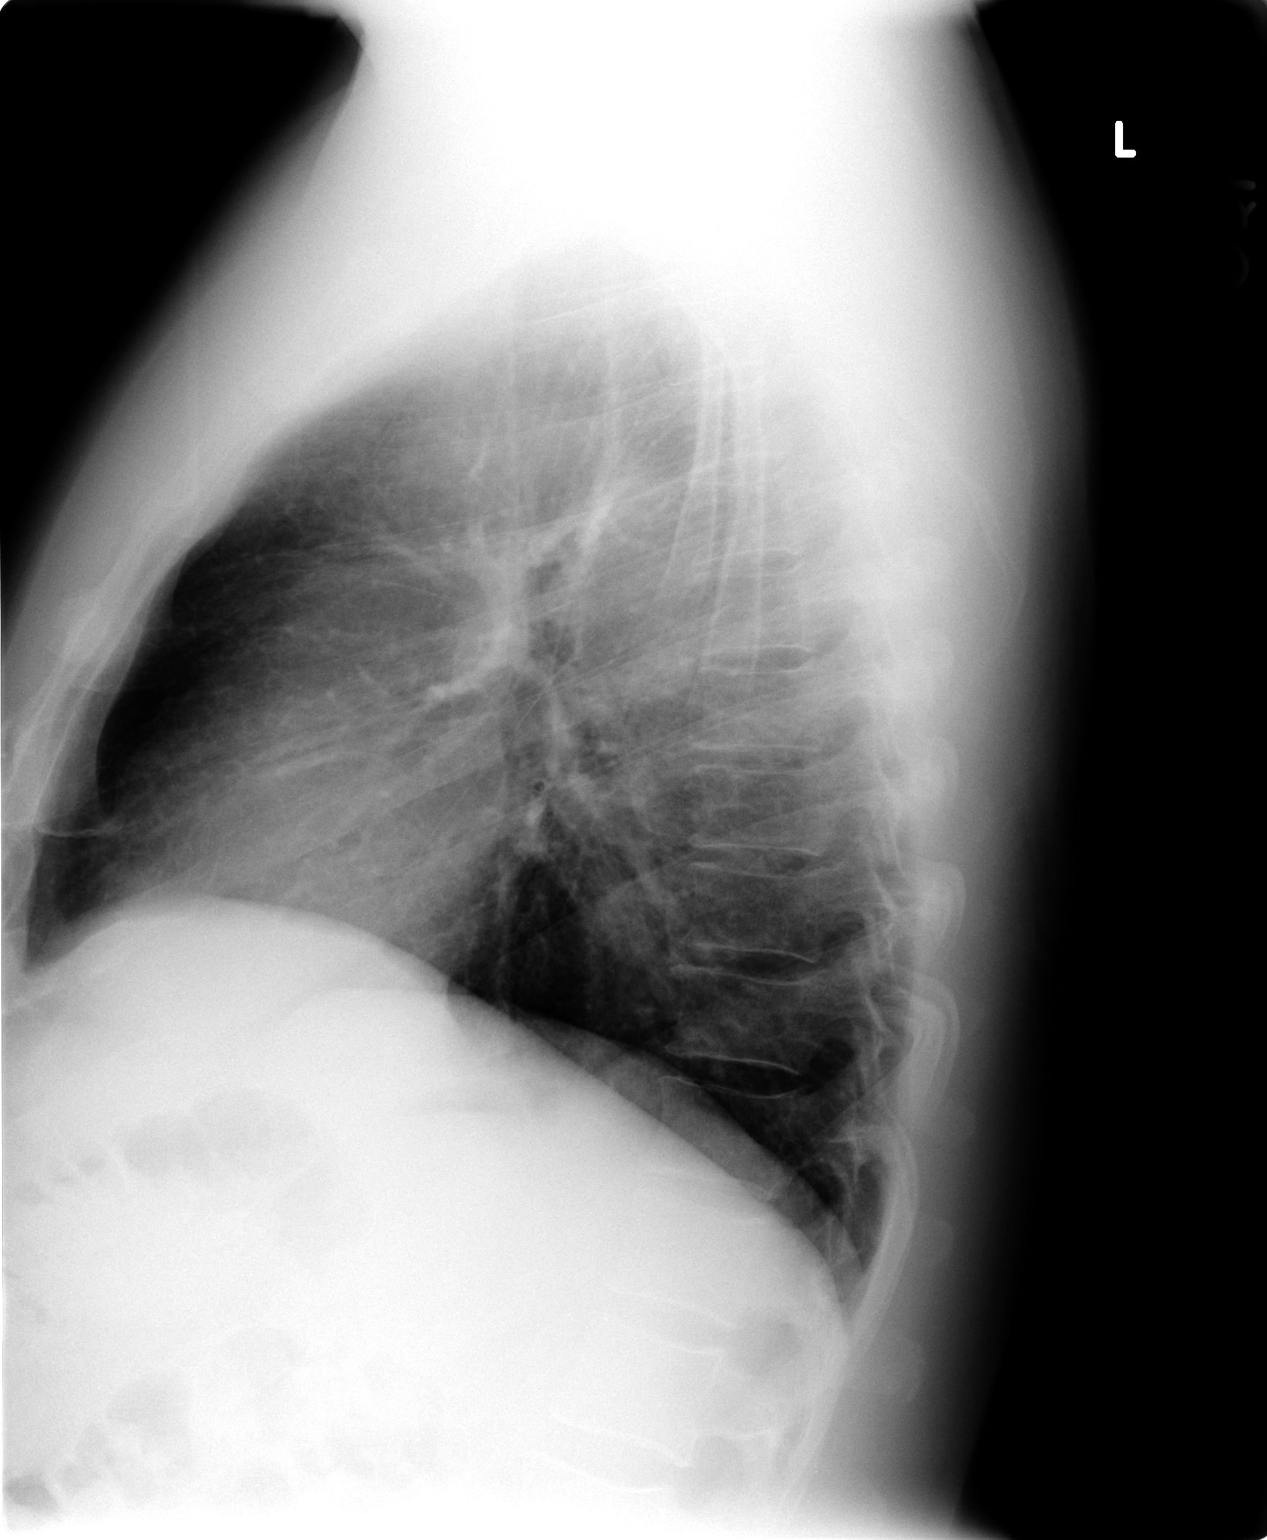

[2 of 2 positions shown; findings below may reference images not displayed]

FINDINGS: Cardiac and mediastinal contours appear normal.

The lungs appear clear.

No pleural effusion is identified.
IMPRESSION: No significant abnormality identified.

## 2014-01-25 ENCOUNTER — Encounter: Payer: Self-pay | Admitting: Internal Medicine

## 2016-03-26 DIAGNOSIS — J3089 Other allergic rhinitis: Secondary | ICD-10-CM | POA: Diagnosis not present

## 2016-04-04 DIAGNOSIS — J3089 Other allergic rhinitis: Secondary | ICD-10-CM | POA: Diagnosis not present

## 2016-04-15 DIAGNOSIS — J3089 Other allergic rhinitis: Secondary | ICD-10-CM | POA: Diagnosis not present

## 2016-04-28 DIAGNOSIS — J3089 Other allergic rhinitis: Secondary | ICD-10-CM | POA: Diagnosis not present

## 2016-05-08 DIAGNOSIS — J3089 Other allergic rhinitis: Secondary | ICD-10-CM | POA: Diagnosis not present

## 2016-05-13 DIAGNOSIS — J3089 Other allergic rhinitis: Secondary | ICD-10-CM | POA: Diagnosis not present

## 2016-05-15 DIAGNOSIS — J3089 Other allergic rhinitis: Secondary | ICD-10-CM | POA: Diagnosis not present

## 2016-05-21 DIAGNOSIS — J3089 Other allergic rhinitis: Secondary | ICD-10-CM | POA: Diagnosis not present

## 2016-05-28 DIAGNOSIS — J3089 Other allergic rhinitis: Secondary | ICD-10-CM | POA: Diagnosis not present

## 2016-05-28 DIAGNOSIS — J301 Allergic rhinitis due to pollen: Secondary | ICD-10-CM | POA: Diagnosis not present

## 2016-06-06 DIAGNOSIS — J3089 Other allergic rhinitis: Secondary | ICD-10-CM | POA: Diagnosis not present

## 2016-06-16 DIAGNOSIS — J3089 Other allergic rhinitis: Secondary | ICD-10-CM | POA: Diagnosis not present

## 2016-06-30 DIAGNOSIS — J3089 Other allergic rhinitis: Secondary | ICD-10-CM | POA: Diagnosis not present

## 2016-07-11 DIAGNOSIS — J3089 Other allergic rhinitis: Secondary | ICD-10-CM | POA: Diagnosis not present

## 2016-07-21 DIAGNOSIS — R509 Fever, unspecified: Secondary | ICD-10-CM | POA: Diagnosis not present

## 2016-07-21 DIAGNOSIS — J209 Acute bronchitis, unspecified: Secondary | ICD-10-CM | POA: Diagnosis not present

## 2016-07-21 DIAGNOSIS — R05 Cough: Secondary | ICD-10-CM | POA: Diagnosis not present

## 2016-07-23 DIAGNOSIS — J3089 Other allergic rhinitis: Secondary | ICD-10-CM | POA: Diagnosis not present

## 2016-08-05 DIAGNOSIS — J3089 Other allergic rhinitis: Secondary | ICD-10-CM | POA: Diagnosis not present

## 2016-08-19 DIAGNOSIS — J3089 Other allergic rhinitis: Secondary | ICD-10-CM | POA: Diagnosis not present

## 2016-08-29 DIAGNOSIS — J3089 Other allergic rhinitis: Secondary | ICD-10-CM | POA: Diagnosis not present

## 2016-09-08 DIAGNOSIS — J3089 Other allergic rhinitis: Secondary | ICD-10-CM | POA: Diagnosis not present

## 2016-09-16 DIAGNOSIS — J3089 Other allergic rhinitis: Secondary | ICD-10-CM | POA: Diagnosis not present

## 2016-09-30 DIAGNOSIS — J3089 Other allergic rhinitis: Secondary | ICD-10-CM | POA: Diagnosis not present

## 2016-10-10 DIAGNOSIS — J3089 Other allergic rhinitis: Secondary | ICD-10-CM | POA: Diagnosis not present

## 2016-10-10 DIAGNOSIS — J4520 Mild intermittent asthma, uncomplicated: Secondary | ICD-10-CM | POA: Diagnosis not present

## 2016-10-16 DIAGNOSIS — J3089 Other allergic rhinitis: Secondary | ICD-10-CM | POA: Diagnosis not present

## 2016-10-29 DIAGNOSIS — J3089 Other allergic rhinitis: Secondary | ICD-10-CM | POA: Diagnosis not present

## 2016-11-11 DIAGNOSIS — J3089 Other allergic rhinitis: Secondary | ICD-10-CM | POA: Diagnosis not present

## 2016-11-25 DIAGNOSIS — J3089 Other allergic rhinitis: Secondary | ICD-10-CM | POA: Diagnosis not present

## 2016-11-28 DIAGNOSIS — J3089 Other allergic rhinitis: Secondary | ICD-10-CM | POA: Diagnosis not present

## 2016-12-08 DIAGNOSIS — J3089 Other allergic rhinitis: Secondary | ICD-10-CM | POA: Diagnosis not present

## 2016-12-19 DIAGNOSIS — J3089 Other allergic rhinitis: Secondary | ICD-10-CM | POA: Diagnosis not present

## 2016-12-27 DIAGNOSIS — Z23 Encounter for immunization: Secondary | ICD-10-CM | POA: Diagnosis not present

## 2017-01-01 DIAGNOSIS — J3089 Other allergic rhinitis: Secondary | ICD-10-CM | POA: Diagnosis not present

## 2017-01-12 DIAGNOSIS — J3089 Other allergic rhinitis: Secondary | ICD-10-CM | POA: Diagnosis not present

## 2017-01-16 DIAGNOSIS — J301 Allergic rhinitis due to pollen: Secondary | ICD-10-CM | POA: Diagnosis not present

## 2017-01-16 DIAGNOSIS — J3089 Other allergic rhinitis: Secondary | ICD-10-CM | POA: Diagnosis not present

## 2017-01-19 DIAGNOSIS — Z0000 Encounter for general adult medical examination without abnormal findings: Secondary | ICD-10-CM | POA: Diagnosis not present

## 2017-01-21 DIAGNOSIS — J3089 Other allergic rhinitis: Secondary | ICD-10-CM | POA: Diagnosis not present

## 2017-01-26 DIAGNOSIS — Z0000 Encounter for general adult medical examination without abnormal findings: Secondary | ICD-10-CM | POA: Diagnosis not present

## 2017-01-26 DIAGNOSIS — M1990 Unspecified osteoarthritis, unspecified site: Secondary | ICD-10-CM | POA: Diagnosis not present

## 2017-01-26 DIAGNOSIS — E7849 Other hyperlipidemia: Secondary | ICD-10-CM | POA: Diagnosis not present

## 2017-01-26 DIAGNOSIS — K635 Polyp of colon: Secondary | ICD-10-CM | POA: Diagnosis not present

## 2017-01-26 DIAGNOSIS — J3089 Other allergic rhinitis: Secondary | ICD-10-CM | POA: Diagnosis not present

## 2017-01-28 DIAGNOSIS — Z1212 Encounter for screening for malignant neoplasm of rectum: Secondary | ICD-10-CM | POA: Diagnosis not present

## 2017-01-29 DIAGNOSIS — M7582 Other shoulder lesions, left shoulder: Secondary | ICD-10-CM | POA: Diagnosis not present

## 2017-01-29 DIAGNOSIS — M542 Cervicalgia: Secondary | ICD-10-CM | POA: Diagnosis not present

## 2017-01-29 DIAGNOSIS — M25522 Pain in left elbow: Secondary | ICD-10-CM | POA: Diagnosis not present

## 2017-02-02 DIAGNOSIS — J3089 Other allergic rhinitis: Secondary | ICD-10-CM | POA: Diagnosis not present

## 2017-02-10 DIAGNOSIS — J3089 Other allergic rhinitis: Secondary | ICD-10-CM | POA: Diagnosis not present

## 2017-02-18 DIAGNOSIS — J3089 Other allergic rhinitis: Secondary | ICD-10-CM | POA: Diagnosis not present

## 2017-03-04 DIAGNOSIS — J3089 Other allergic rhinitis: Secondary | ICD-10-CM | POA: Diagnosis not present

## 2017-03-09 DIAGNOSIS — M25512 Pain in left shoulder: Secondary | ICD-10-CM | POA: Diagnosis not present

## 2017-03-11 DIAGNOSIS — M25512 Pain in left shoulder: Secondary | ICD-10-CM | POA: Diagnosis not present

## 2017-03-13 DIAGNOSIS — J3089 Other allergic rhinitis: Secondary | ICD-10-CM | POA: Diagnosis not present

## 2017-03-25 DIAGNOSIS — J3089 Other allergic rhinitis: Secondary | ICD-10-CM | POA: Diagnosis not present

## 2017-04-08 DIAGNOSIS — J3089 Other allergic rhinitis: Secondary | ICD-10-CM | POA: Diagnosis not present

## 2017-04-15 DIAGNOSIS — G8918 Other acute postprocedural pain: Secondary | ICD-10-CM | POA: Diagnosis not present

## 2017-04-15 DIAGNOSIS — M24112 Other articular cartilage disorders, left shoulder: Secondary | ICD-10-CM | POA: Diagnosis not present

## 2017-04-15 DIAGNOSIS — M94212 Chondromalacia, left shoulder: Secondary | ICD-10-CM | POA: Diagnosis not present

## 2017-04-15 DIAGNOSIS — S43492A Other sprain of left shoulder joint, initial encounter: Secondary | ICD-10-CM | POA: Diagnosis not present

## 2017-04-17 DIAGNOSIS — M6281 Muscle weakness (generalized): Secondary | ICD-10-CM | POA: Diagnosis not present

## 2017-04-17 DIAGNOSIS — M25512 Pain in left shoulder: Secondary | ICD-10-CM | POA: Diagnosis not present

## 2017-04-21 DIAGNOSIS — M6281 Muscle weakness (generalized): Secondary | ICD-10-CM | POA: Diagnosis not present

## 2017-04-21 DIAGNOSIS — M25512 Pain in left shoulder: Secondary | ICD-10-CM | POA: Diagnosis not present

## 2017-04-23 DIAGNOSIS — J3089 Other allergic rhinitis: Secondary | ICD-10-CM | POA: Diagnosis not present

## 2017-04-24 DIAGNOSIS — M25512 Pain in left shoulder: Secondary | ICD-10-CM | POA: Diagnosis not present

## 2017-04-24 DIAGNOSIS — M6281 Muscle weakness (generalized): Secondary | ICD-10-CM | POA: Diagnosis not present

## 2017-04-27 DIAGNOSIS — M25512 Pain in left shoulder: Secondary | ICD-10-CM | POA: Diagnosis not present

## 2017-04-27 DIAGNOSIS — M6281 Muscle weakness (generalized): Secondary | ICD-10-CM | POA: Diagnosis not present

## 2017-04-29 DIAGNOSIS — M25512 Pain in left shoulder: Secondary | ICD-10-CM | POA: Diagnosis not present

## 2017-04-29 DIAGNOSIS — M6281 Muscle weakness (generalized): Secondary | ICD-10-CM | POA: Diagnosis not present

## 2017-05-04 DIAGNOSIS — M25512 Pain in left shoulder: Secondary | ICD-10-CM | POA: Diagnosis not present

## 2017-05-04 DIAGNOSIS — M6281 Muscle weakness (generalized): Secondary | ICD-10-CM | POA: Diagnosis not present

## 2017-05-04 DIAGNOSIS — J3089 Other allergic rhinitis: Secondary | ICD-10-CM | POA: Diagnosis not present

## 2017-05-07 DIAGNOSIS — M25512 Pain in left shoulder: Secondary | ICD-10-CM | POA: Diagnosis not present

## 2017-05-07 DIAGNOSIS — M6281 Muscle weakness (generalized): Secondary | ICD-10-CM | POA: Diagnosis not present

## 2017-05-11 DIAGNOSIS — M6281 Muscle weakness (generalized): Secondary | ICD-10-CM | POA: Diagnosis not present

## 2017-05-11 DIAGNOSIS — M25512 Pain in left shoulder: Secondary | ICD-10-CM | POA: Diagnosis not present

## 2017-05-14 DIAGNOSIS — M25512 Pain in left shoulder: Secondary | ICD-10-CM | POA: Diagnosis not present

## 2017-05-14 DIAGNOSIS — M6281 Muscle weakness (generalized): Secondary | ICD-10-CM | POA: Diagnosis not present

## 2017-05-14 DIAGNOSIS — J3089 Other allergic rhinitis: Secondary | ICD-10-CM | POA: Diagnosis not present

## 2017-05-18 DIAGNOSIS — M25512 Pain in left shoulder: Secondary | ICD-10-CM | POA: Diagnosis not present

## 2017-05-18 DIAGNOSIS — M6281 Muscle weakness (generalized): Secondary | ICD-10-CM | POA: Diagnosis not present

## 2017-05-25 DIAGNOSIS — M6281 Muscle weakness (generalized): Secondary | ICD-10-CM | POA: Diagnosis not present

## 2017-05-25 DIAGNOSIS — M25512 Pain in left shoulder: Secondary | ICD-10-CM | POA: Diagnosis not present

## 2017-05-27 DIAGNOSIS — J3089 Other allergic rhinitis: Secondary | ICD-10-CM | POA: Diagnosis not present

## 2017-05-28 DIAGNOSIS — M25512 Pain in left shoulder: Secondary | ICD-10-CM | POA: Diagnosis not present

## 2017-05-28 DIAGNOSIS — M6281 Muscle weakness (generalized): Secondary | ICD-10-CM | POA: Diagnosis not present

## 2017-06-08 DIAGNOSIS — J3089 Other allergic rhinitis: Secondary | ICD-10-CM | POA: Diagnosis not present

## 2017-06-10 DIAGNOSIS — M25512 Pain in left shoulder: Secondary | ICD-10-CM | POA: Diagnosis not present

## 2017-06-10 DIAGNOSIS — M6281 Muscle weakness (generalized): Secondary | ICD-10-CM | POA: Diagnosis not present

## 2017-06-22 DIAGNOSIS — J3089 Other allergic rhinitis: Secondary | ICD-10-CM | POA: Diagnosis not present

## 2017-07-06 DIAGNOSIS — J3089 Other allergic rhinitis: Secondary | ICD-10-CM | POA: Diagnosis not present

## 2017-07-20 DIAGNOSIS — J3089 Other allergic rhinitis: Secondary | ICD-10-CM | POA: Diagnosis not present

## 2017-08-03 DIAGNOSIS — J3089 Other allergic rhinitis: Secondary | ICD-10-CM | POA: Diagnosis not present

## 2017-08-04 DIAGNOSIS — J3089 Other allergic rhinitis: Secondary | ICD-10-CM | POA: Diagnosis not present

## 2017-08-18 DIAGNOSIS — J3089 Other allergic rhinitis: Secondary | ICD-10-CM | POA: Diagnosis not present

## 2017-08-31 DIAGNOSIS — J3089 Other allergic rhinitis: Secondary | ICD-10-CM | POA: Diagnosis not present

## 2017-09-14 DIAGNOSIS — J3089 Other allergic rhinitis: Secondary | ICD-10-CM | POA: Diagnosis not present

## 2017-09-18 DIAGNOSIS — J3089 Other allergic rhinitis: Secondary | ICD-10-CM | POA: Diagnosis not present

## 2017-09-21 DIAGNOSIS — J3089 Other allergic rhinitis: Secondary | ICD-10-CM | POA: Diagnosis not present

## 2017-09-30 DIAGNOSIS — J3089 Other allergic rhinitis: Secondary | ICD-10-CM | POA: Diagnosis not present

## 2017-10-05 DIAGNOSIS — J3089 Other allergic rhinitis: Secondary | ICD-10-CM | POA: Diagnosis not present

## 2017-10-09 DIAGNOSIS — J3089 Other allergic rhinitis: Secondary | ICD-10-CM | POA: Diagnosis not present

## 2017-10-09 DIAGNOSIS — J4520 Mild intermittent asthma, uncomplicated: Secondary | ICD-10-CM | POA: Diagnosis not present

## 2017-10-21 DIAGNOSIS — J3089 Other allergic rhinitis: Secondary | ICD-10-CM | POA: Diagnosis not present

## 2017-11-02 DIAGNOSIS — J3089 Other allergic rhinitis: Secondary | ICD-10-CM | POA: Diagnosis not present

## 2017-11-11 DIAGNOSIS — J3089 Other allergic rhinitis: Secondary | ICD-10-CM | POA: Diagnosis not present

## 2017-11-24 DIAGNOSIS — J3089 Other allergic rhinitis: Secondary | ICD-10-CM | POA: Diagnosis not present

## 2017-12-10 DIAGNOSIS — J3089 Other allergic rhinitis: Secondary | ICD-10-CM | POA: Diagnosis not present

## 2017-12-21 DIAGNOSIS — J3089 Other allergic rhinitis: Secondary | ICD-10-CM | POA: Diagnosis not present

## 2018-01-01 DIAGNOSIS — J3089 Other allergic rhinitis: Secondary | ICD-10-CM | POA: Diagnosis not present

## 2018-01-08 DIAGNOSIS — Z23 Encounter for immunization: Secondary | ICD-10-CM | POA: Diagnosis not present

## 2018-01-11 DIAGNOSIS — C4441 Basal cell carcinoma of skin of scalp and neck: Secondary | ICD-10-CM | POA: Diagnosis not present

## 2018-01-11 DIAGNOSIS — L820 Inflamed seborrheic keratosis: Secondary | ICD-10-CM | POA: Diagnosis not present

## 2018-01-11 DIAGNOSIS — J3089 Other allergic rhinitis: Secondary | ICD-10-CM | POA: Diagnosis not present

## 2018-01-19 DIAGNOSIS — J029 Acute pharyngitis, unspecified: Secondary | ICD-10-CM | POA: Diagnosis not present

## 2018-01-19 DIAGNOSIS — C4441 Basal cell carcinoma of skin of scalp and neck: Secondary | ICD-10-CM | POA: Diagnosis not present

## 2018-01-19 DIAGNOSIS — J302 Other seasonal allergic rhinitis: Secondary | ICD-10-CM | POA: Diagnosis not present

## 2018-01-20 ENCOUNTER — Encounter: Payer: Self-pay | Admitting: Internal Medicine

## 2018-01-20 DIAGNOSIS — R1313 Dysphagia, pharyngeal phase: Secondary | ICD-10-CM | POA: Diagnosis not present

## 2018-01-20 DIAGNOSIS — K219 Gastro-esophageal reflux disease without esophagitis: Secondary | ICD-10-CM | POA: Diagnosis not present

## 2018-01-22 DIAGNOSIS — J3089 Other allergic rhinitis: Secondary | ICD-10-CM | POA: Diagnosis not present

## 2018-01-25 ENCOUNTER — Encounter: Payer: Self-pay | Admitting: Podiatry

## 2018-01-25 ENCOUNTER — Ambulatory Visit: Payer: 59 | Admitting: Podiatry

## 2018-01-25 ENCOUNTER — Ambulatory Visit (INDEPENDENT_AMBULATORY_CARE_PROVIDER_SITE_OTHER): Payer: 59

## 2018-01-25 VITALS — BP 129/78 | HR 69

## 2018-01-25 DIAGNOSIS — M778 Other enthesopathies, not elsewhere classified: Secondary | ICD-10-CM

## 2018-01-25 DIAGNOSIS — D3610 Benign neoplasm of peripheral nerves and autonomic nervous system, unspecified: Secondary | ICD-10-CM

## 2018-01-25 DIAGNOSIS — M779 Enthesopathy, unspecified: Secondary | ICD-10-CM

## 2018-01-25 NOTE — Patient Instructions (Signed)
Pre-Operative Instructions  Congratulations, you have decided to take an important step towards improving your quality of life.  You can be assured that the doctors and staff at [facility] will be with you every step of the way.  Here are some important things you should know:  1. Plan to be at the surgery center/hospital at least 1 (one) hour prior to your scheduled time, unless otherwise directed by the surgical center/hospital staff.  You must have a responsible adult accompany you, remain during the surgery and drive you home.  Make sure you have directions to the surgical center/hospital to ensure you arrive on time. 2. If you are having surgery at [facility] or [facility], you will need a copy of your medical history and physical form from your family physician within one month prior to the date of surgery. We will give you a form for your primary physician to complete.  3. We make every effort to accommodate the date you request for surgery.  However, there are times where surgery dates or times have to be moved.  We will contact you as soon as possible if a change in schedule is required.   4. No aspirin/ibuprofen for one week before surgery.  If you are on aspirin, any non-steroidal anti-inflammatory medications (Mobic, Aleve, Ibuprofen) should not be taken seven (7) days prior to your surgery.  You make take Tylenol for pain prior to surgery.  5. Medications - If you are taking daily heart and blood pressure medications, seizure, reflux, allergy, asthma, anxiety, pain or diabetes medications, make sure you notify the surgery center/hospital before the day of surgery so they can tell you which medications you should take or avoid the day of surgery. 6. No food or drink after midnight the night before surgery unless directed otherwise by surgical center/hospital staff. 7. No alcoholic beverages 24-hours prior to surgery.  No smoking 24-hours prior or 24-hours after  surgery. 8. Wear loose pants or shorts. They should be loose enough to fit over bandages, boots, and casts. 9. Don't wear slip-on shoes. Sneakers are preferred. 10. Bring your boot with you to the surgery center/hospital.  Also bring crutches or a walker if your physician has prescribed it for you.  If you do not have this equipment, it will be provided for you after surgery. 11. If you have not been contacted by the surgery center/hospital by the day before your surgery, call to confirm the date and time of your surgery. 12. Leave-time from work may vary depending on the type of surgery you have.  Appropriate arrangements should be made prior to surgery with your employer. 13. Prescriptions will be provided immediately following surgery by your doctor.  Fill these as soon as possible after surgery and take the medication as directed. Pain medications will not be refilled on weekends and must be approved by the doctor. 14. Remove nail polish on the operative foot and avoid getting pedicures prior to surgery. 15. Wash the night before surgery.  The night before surgery wash the foot and leg well with water and the antibacterial soap provided. Be sure to pay special attention to beneath the toenails and in between the toes.  Wash for at least three (3) minutes. Rinse thoroughly with water and dry well with a towel.  Perform this wash unless told not to do so by your physician.  Enclosed: 1 Ice pack (please put in freezer the night before surgery)   1 Hibiclens skin cleaner     Pre-op instructions  If you have any questions regarding the instructions, please do not hesitate to call our office.  [place]: [address], , [place] [address] -- [phone]  [place]: [address] [place], [place] [address] -- [phone]  [place]: [address]  [place], [place] [address] -- [phone]  [place]: [address], [address], [place], [place] [address] -- [phone]  Website:  [email or web address] 

## 2018-01-27 NOTE — Progress Notes (Signed)
Subjective:   Patient ID: David Mercado, male   DOB: 57 y.o.   MRN: [id number]   HPI Patient presents with exquisite radiating discomfort between the third and fourth toes right foot and states is not able to wear most of his shoes.  Patient states that it is gotten worse over time and that it gradually has made it more difficult for him to walk.  Patient does not remember specific injury and states that it is gradually become more aggravating over this time and patient does not smoke and likes to be active   Review of Systems  All other systems reviewed and are negative.       Objective:  Physical Exam  Constitutional: He appears well-developed and well-nourished.  Cardiovascular: Intact distal pulses.  Pulmonary/Chest: Effort normal.  Musculoskeletal: Normal range of motion.  Neurological: He is alert.  Skin: Skin is warm.  Nursing note and vitals reviewed.   Neurovascular status intact muscle strength adequate range of motion within normal limits with exquisite discomfort third interspace right foot with shooting pain into the adjacent digits.  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  He wants to get it done soon due to his schedule and at this point I did allow him to read consent form going over in great detail alternative treatments complications and the fact there is no long-term guarantee this will solve his problem.  Patient is willing to accept risk signed consent form and is scheduled for outpatient surgery and is encouraged to call with any questions he may have prior to the procedure understanding told recovery can take 6 months  X-ray was negative for signs of fracture or bony  calcification

## 2018-01-29 ENCOUNTER — Telehealth: Payer: Self-pay | Admitting: *Deleted

## 2018-01-29 NOTE — Telephone Encounter (Signed)
"  I'm calling for my husband, David Mercado.  He had tried to call earlier to set up surgery with Dr. Paulla Dolly.  He's going to me in meetings all afternoon and wouldn't be able to take your call.  So, he wanted me to call and just tell you, you can call me back with the surgery time."

## 2018-02-02 DIAGNOSIS — J3089 Other allergic rhinitis: Secondary | ICD-10-CM | POA: Diagnosis not present

## 2018-02-03 NOTE — Telephone Encounter (Addendum)
"  I had tried to call and schedule surgery for my husband, David Mercado.  I don't know if this is how long it takes to hear back.  I don't know if my message got lost.  Please call me back."  I'm returning your call.  David Mercado scheduled his surgery when he was here.  "When is it?"  He scheduled it for November 19.  "I can't do it that date.  Can we do it on November 25?"  He can't but he can do it on November 26.  "I can't do that date either."  Dr. Paulla Dolly only does surgeries on Tuesdays.  "Okay, I guess it'll have to be done in December.  How about December 10?"  That date is available.  I'll get it scheduled.  "Okay, thank you so much.  Sorry for the confusion."  I changed the date in [facility].

## 2018-02-04 NOTE — Telephone Encounter (Signed)
Postop appointments have been rescheduled. °

## 2018-02-11 DIAGNOSIS — Z0000 Encounter for general adult medical examination without abnormal findings: Secondary | ICD-10-CM | POA: Diagnosis not present

## 2018-02-11 DIAGNOSIS — Z125 Encounter for screening for malignant neoplasm of prostate: Secondary | ICD-10-CM | POA: Diagnosis not present

## 2018-02-11 DIAGNOSIS — R82998 Other abnormal findings in urine: Secondary | ICD-10-CM | POA: Diagnosis not present

## 2018-02-11 DIAGNOSIS — E7849 Other hyperlipidemia: Secondary | ICD-10-CM | POA: Diagnosis not present

## 2018-02-15 DIAGNOSIS — J3089 Other allergic rhinitis: Secondary | ICD-10-CM | POA: Diagnosis not present

## 2018-02-17 ENCOUNTER — Other Ambulatory Visit: Payer: 59

## 2018-02-26 DIAGNOSIS — J45901 Unspecified asthma with (acute) exacerbation: Secondary | ICD-10-CM | POA: Diagnosis not present

## 2018-02-26 DIAGNOSIS — Z1389 Encounter for screening for other disorder: Secondary | ICD-10-CM | POA: Diagnosis not present

## 2018-02-26 DIAGNOSIS — K219 Gastro-esophageal reflux disease without esophagitis: Secondary | ICD-10-CM | POA: Diagnosis not present

## 2018-02-26 DIAGNOSIS — E7849 Other hyperlipidemia: Secondary | ICD-10-CM | POA: Diagnosis not present

## 2018-02-26 DIAGNOSIS — J3089 Other allergic rhinitis: Secondary | ICD-10-CM | POA: Diagnosis not present

## 2018-02-26 DIAGNOSIS — Z0000 Encounter for general adult medical examination without abnormal findings: Secondary | ICD-10-CM | POA: Diagnosis not present

## 2018-03-02 ENCOUNTER — Telehealth: Payer: Self-pay | Admitting: Podiatry

## 2018-03-02 ENCOUNTER — Encounter: Payer: Self-pay | Admitting: Podiatry

## 2018-03-02 DIAGNOSIS — G5781 Other specified mononeuropathies of right lower limb: Secondary | ICD-10-CM | POA: Diagnosis not present

## 2018-03-02 DIAGNOSIS — D3610 Benign neoplasm of peripheral nerves and autonomic nervous system, unspecified: Secondary | ICD-10-CM | POA: Diagnosis not present

## 2018-03-02 DIAGNOSIS — G5761 Lesion of plantar nerve, right lower limb: Secondary | ICD-10-CM

## 2018-03-02 DIAGNOSIS — Z1212 Encounter for screening for malignant neoplasm of rectum: Secondary | ICD-10-CM | POA: Diagnosis not present

## 2018-03-02 NOTE — Telephone Encounter (Signed)
Patient wanted to know when he is able to drive.

## 2018-03-03 NOTE — Telephone Encounter (Signed)
3-4 days

## 2018-03-04 ENCOUNTER — Encounter: Payer: Self-pay | Admitting: Podiatry

## 2018-03-04 DIAGNOSIS — J3089 Other allergic rhinitis: Secondary | ICD-10-CM | POA: Diagnosis not present

## 2018-03-10 ENCOUNTER — Encounter: Payer: Self-pay | Admitting: Podiatry

## 2018-03-10 ENCOUNTER — Ambulatory Visit (INDEPENDENT_AMBULATORY_CARE_PROVIDER_SITE_OTHER): Payer: 59 | Admitting: Podiatry

## 2018-03-10 DIAGNOSIS — M779 Enthesopathy, unspecified: Secondary | ICD-10-CM

## 2018-03-10 DIAGNOSIS — J3089 Other allergic rhinitis: Secondary | ICD-10-CM | POA: Diagnosis not present

## 2018-03-10 DIAGNOSIS — M778 Other enthesopathies, not elsewhere classified: Secondary | ICD-10-CM

## 2018-03-10 DIAGNOSIS — D3610 Benign neoplasm of peripheral nerves and autonomic nervous system, unspecified: Secondary | ICD-10-CM

## 2018-03-10 NOTE — Progress Notes (Signed)
Subjective:   Patient ID: David Mercado, male   DOB: 57 y.o.   MRN: [id number]   HPI Patient states doing great with the foot with minimal discomfort and very pleased   ROS      Objective:  Physical Exam  Neurovascular status intact negative Homans sign noted wound edges well coapted third interspace right     Assessment:  Doing very well post neurectomy right     Plan:  Sterile dressing reapplied advised to continued elevation compression it has been reviewed gradually increase his activity over the next few weeks patient will be seen back for [place] to recheck as needed

## 2018-03-22 DIAGNOSIS — J3089 Other allergic rhinitis: Secondary | ICD-10-CM | POA: Diagnosis not present

## 2018-03-23 ENCOUNTER — Ambulatory Visit (INDEPENDENT_AMBULATORY_CARE_PROVIDER_SITE_OTHER): Payer: 59 | Admitting: Podiatry

## 2018-03-23 DIAGNOSIS — L03031 Cellulitis of right toe: Secondary | ICD-10-CM

## 2018-03-23 DIAGNOSIS — Z9889 Other specified postprocedural states: Secondary | ICD-10-CM

## 2018-03-23 DIAGNOSIS — L02611 Cutaneous abscess of right foot: Secondary | ICD-10-CM

## 2018-03-23 MED ORDER — CEPHALEXIN 500 MG PO CAPS
500.0000 mg | ORAL_CAPSULE | Freq: Two times a day (BID) | ORAL | 0 refills | Status: DC
Start: 2018-03-23 — End: 2018-04-19

## 2018-03-23 NOTE — Progress Notes (Signed)
  Subjective:  Patient ID: David Mercado, male    DOB: Nov 05, 1960,  MRN: [id number]  No chief complaint on file.  57 y.o. male returns for post-op check. Underwent neuroma exicison by Dr. Paulla Dolly states the area was doing great except for a little new redness over the past few days that had him concerned.  Review of Systems: Negative except as noted in the HPI. Denies N/V/F/Ch.  Past Medical History:  Diagnosis Date  . Allergy   . Asthma   . Chronic headaches   . Hyperlipidemia     Current Outpatient Medications:  .  albuterol (PROAIR HFA) 108 (90 BASE) MCG/ACT inhaler, Inhale 2 puffs into the lungs every 6 (six) hours as needed.  , Disp: , Rfl:  .  B-D TB SYRINGE 1CC/26GX3/8" 26G X 3/8" 1 ML MISC, USE TO GIVE ALLERGY VACCINE OR OTHER MEDICATIONS AS DIRECTED., Disp: 100 each, Rfl: 0 .  cephALEXin (KEFLEX) 500 MG capsule, Take 1 capsule (500 mg total) by mouth 2 (two) times daily., Disp: 14 capsule, Rfl: 0 .  EPIPEN 0.3 MG/0.3ML DEVI, USE AS DIRECTED FOR SEVERE ALLERGIC REACTION, Disp: 1 each, Rfl: 5 .  ibuprofen (ADVIL,MOTRIN) 200 MG tablet, Take 200 mg by mouth every 6 (six) hours as needed.  , Disp: , Rfl:  .  levocetirizine (XYZAL) 5 MG tablet, Take 5 mg by mouth every evening., Disp: , Rfl:  .  Multiple Vitamin (MULTIVITAMIN) capsule, Take 1 capsule by mouth daily.  , Disp: , Rfl:  .  rosuvastatin (CRESTOR) 5 MG tablet, Take 5 mg by mouth daily., Disp: , Rfl:   Social History   Tobacco Use  Smoking Status Never Smoker  Smokeless Tobacco Former Systems developer  Tobacco Comment   x 30 years off and on    No Known Allergies Objective:  There were no vitals filed for this visit. There is no height or weight on file to calculate BMI. Constitutional Well developed. Well nourished.  Vascular Foot warm and well perfused. Capillary refill normal to all digits.   Neurologic Normal speech. Oriented to person, place, and time. Epicritic sensation to light touch grossly present bilaterally.    Dermatologic Skin well healed slight redness along incision but without active warmth.  Orthopedic: Tenderness to palpation noted about the surgical site.   Radiographs: None today Assessment:   1. Post-operative state   2. Cellulitis and abscess of toe, right    Plan:  Patient was evaluated and treated and all questions answered.  S/p foot surgery right -Progressing as expected post-operatively. -XR: None -WB Status: WBAT in normal shoe -Sutures: ends cut. -Medications: Rx Keflex for slight erythema -Foot redressed. -Recommended f/u with Dr. Paulla Dolly in 2 weeks should redness not resolve.  No follow-ups on file.

## 2018-04-02 ENCOUNTER — Encounter: Payer: Self-pay | Admitting: Internal Medicine

## 2018-04-02 DIAGNOSIS — J3089 Other allergic rhinitis: Secondary | ICD-10-CM | POA: Diagnosis not present

## 2018-04-09 DIAGNOSIS — J069 Acute upper respiratory infection, unspecified: Secondary | ICD-10-CM | POA: Diagnosis not present

## 2018-04-09 DIAGNOSIS — Z6827 Body mass index (BMI) 27.0-27.9, adult: Secondary | ICD-10-CM | POA: Diagnosis not present

## 2018-04-09 DIAGNOSIS — B309 Viral conjunctivitis, unspecified: Secondary | ICD-10-CM | POA: Diagnosis not present

## 2018-04-14 DIAGNOSIS — J3089 Other allergic rhinitis: Secondary | ICD-10-CM | POA: Diagnosis not present

## 2018-04-19 ENCOUNTER — Ambulatory Visit (AMBULATORY_SURGERY_CENTER): Payer: Self-pay

## 2018-04-19 ENCOUNTER — Encounter: Payer: Self-pay | Admitting: Internal Medicine

## 2018-04-19 VITALS — Ht 72.0 in | Wt 208.2 lb

## 2018-04-19 DIAGNOSIS — Z86010 Personal history of colonic polyps: Secondary | ICD-10-CM

## 2018-04-19 MED ORDER — NA SULFATE-K SULFATE-MG SULF 17.5-3.13-1.6 GM/177ML PO SOLN: 1.0000 | Freq: Once | ORAL | 0 refills | Status: AC

## 2018-04-19 NOTE — Progress Notes (Signed)
Denies allergies to eggs or soy products. Denies complication of anesthesia or sedation. Denies use of weight loss medication. Denies use of O2.   Emmi instructions declined.   A pay no more than 50.00 coupon for Suprep was given to the patient.  

## 2018-04-23 DIAGNOSIS — J3089 Other allergic rhinitis: Secondary | ICD-10-CM | POA: Diagnosis not present

## 2018-05-03 ENCOUNTER — Encounter: Payer: Self-pay | Admitting: Internal Medicine

## 2018-05-03 ENCOUNTER — Ambulatory Visit (AMBULATORY_SURGERY_CENTER): Payer: 59 | Admitting: Internal Medicine

## 2018-05-03 VITALS — BP 102/48 | HR 68 | Temp 97.7°F | Resp 17 | Ht 72.0 in | Wt 208.0 lb

## 2018-05-03 DIAGNOSIS — Z86010 Personal history of colonic polyps: Secondary | ICD-10-CM | POA: Diagnosis not present

## 2018-05-03 MED ORDER — SODIUM CHLORIDE 0.9 % IV SOLN: 500.0000 mL | Freq: Once | INTRAVENOUS | Status: DC

## 2018-05-03 NOTE — Progress Notes (Signed)
Pt's states no medical or surgical changes since previsit or office visit. 

## 2018-05-03 NOTE — Op Note (Signed)
[facility] Patient Name: David Mercado Procedure Date: 05/03/2018 8:05 AM MRN: [id number] Endoscopist: Docia Chuck. Henrene Pastor , MD Age: 58 Referring MD:  Date of Birth: Jul 29, 1960 Gender: Male Account #: [id number] Procedure:                Colonoscopy Indications:              High risk colon cancer surveillance: Personal                            history of non-advanced adenoma. Previous                            examination October 2014 Medicines:                Monitored Anesthesia Care Procedure:                Pre-Anesthesia Assessment:                           - Prior to the procedure, a History and Physical                            was performed, and patient medications and                            allergies were reviewed. The patient's tolerance of                            previous anesthesia was also reviewed. The risks                            and benefits of the procedure and the sedation                            options and risks were discussed with the patient.                            All questions were answered, and informed consent                            was obtained. Prior Anticoagulants: The patient has                            taken no previous anticoagulant or antiplatelet                            agents. ASA Grade Assessment: II - A patient with                            mild systemic disease. After reviewing the risks                            and benefits, the patient was deemed in  satisfactory condition to undergo the procedure.                           After obtaining informed consent, the colonoscope                            was passed under direct vision. Throughout the                            procedure, the patient's blood pressure, pulse, and                            oxygen saturations were monitored continuously. The                            Colonoscope was introduced through the anus and                          advanced to the the cecum, identified by                            appendiceal orifice and ileocecal valve. The                            ileocecal valve, appendiceal orifice, and rectum                            were photographed. The quality of the bowel                            preparation was excellent. The colonoscopy was                            performed without difficulty. The patient tolerated                            the procedure well. The bowel preparation used was                            SUPREP. Scope In: 8:18:38 AM Scope Out: 8:30:02 AM Scope Withdrawal Time: 0 hours 9 minutes 17 seconds  Total Procedure Duration: 0 hours 11 minutes 24 seconds  Findings:                 The entire examined colon appeared normal on direct                            and retroflexion views. Complications:            No immediate complications. Estimated blood loss:                            None. Estimated Blood Loss:     Estimated blood loss: none. Impression:               - The entire examined colon is normal on direct and  retroflexion views.                           - No specimens collected. Recommendation:           - Repeat colonoscopy in 10 years for surveillance.                           - Patient has a contact number available for                            emergencies. The signs and symptoms of potential                            delayed complications were discussed with the                            patient. Return to normal activities tomorrow.                            Written discharge instructions were provided to the                            patient.                           - Resume previous diet.                           - Continue present medications. Docia Chuck. Henrene Pastor, MD 05/03/2018 8:35:10 AM This report has been signed electronically.

## 2018-05-03 NOTE — Progress Notes (Signed)
To PACU, VSS. Report to [email or web address] 

## 2018-05-03 NOTE — Patient Instructions (Signed)
Discharge instructions given. Normal exam. Resume previous medications. YOU HAD AN ENDOSCOPIC PROCEDURE TODAY AT THE [facility]:   Refer to the procedure report that was given to you for any specific questions about what was found during the examination.  If the procedure report does not answer your questions, please call your gastroenterologist to clarify.  If you requested that your care partner not be given the details of your procedure findings, then the procedure report has been included in a sealed envelope for you to review at your convenience later.  YOU SHOULD EXPECT: Some feelings of bloating in the abdomen. Passage of more gas than usual.  Walking can help get rid of the air that was put into your GI tract during the procedure and reduce the bloating. If you had a lower endoscopy (such as a colonoscopy or flexible sigmoidoscopy) you may notice spotting of blood in your stool or on the toilet paper. If you underwent a bowel prep for your procedure, you may not have a normal bowel movement for a few days.  Please Note:  You might notice some irritation and congestion in your nose or some drainage.  This is from the oxygen used during your procedure.  There is no need for concern and it should clear up in a day or so.  SYMPTOMS TO REPORT IMMEDIATELY:   Following lower endoscopy (colonoscopy or flexible sigmoidoscopy):  Excessive amounts of blood in the stool  Significant tenderness or worsening of abdominal pains  Swelling of the abdomen that is new, acute  Fever of 100F or higher   For urgent or emergent issues, a gastroenterologist can be reached at any hour by calling [phone].   DIET:  We do recommend a small meal at first, but then you may proceed to your regular diet.  Drink plenty of fluids but you should avoid alcoholic beverages for 24 hours.  ACTIVITY:  You should plan to take it easy for the rest of today and you should NOT DRIVE or use heavy machinery  until tomorrow (because of the sedation medicines used during the test).    FOLLOW UP: Our staff will call the number listed on your records the next business day following your procedure to check on you and address any questions or concerns that you may have regarding the information given to you following your procedure. If we do not reach you, we will leave a message.  However, if you are feeling well and you are not experiencing any problems, there is no need to return our call.  We will assume that you have returned to your regular daily activities without incident.  If any biopsies were taken you will be contacted by phone or by letter within the next 1-3 weeks.  Please call us at [phone] if you have not heard about the biopsies in 3 weeks.    SIGNATURES/CONFIDENTIALITY: You and/or your care partner have signed paperwork which will be entered into your electronic medical record.  These signatures attest to the fact that that the information above on your After Visit Summary has been reviewed and is understood.  Full responsibility of the confidentiality of this discharge information lies with you and/or your care-partner. 

## 2018-05-04 ENCOUNTER — Telehealth: Payer: Self-pay

## 2018-05-04 NOTE — Telephone Encounter (Signed)
  Follow up Call-  Call back number 05/03/2018  Post procedure Call Back phone  # [phone]  Permission to leave phone message Yes  Some recent data might be hidden     Patient questions:  Do you have a fever, pain , or abdominal swelling? No. Pain Score  0 *  Have you tolerated food without any problems? Yes.    Have you been able to return to your normal activities? Yes.    Do you have any questions about your discharge instructions: Diet   No. Medications  No. Follow up visit  No.  Do you have questions or concerns about your Care? No.  Actions: * If pain score is 4 or above: No action needed, pain <4.  No problems noted per pt. maw

## 2018-05-05 DIAGNOSIS — J3089 Other allergic rhinitis: Secondary | ICD-10-CM | POA: Diagnosis not present

## 2018-05-13 DIAGNOSIS — J3089 Other allergic rhinitis: Secondary | ICD-10-CM | POA: Diagnosis not present

## 2018-05-20 DIAGNOSIS — J3089 Other allergic rhinitis: Secondary | ICD-10-CM | POA: Diagnosis not present

## 2018-05-27 DIAGNOSIS — J3089 Other allergic rhinitis: Secondary | ICD-10-CM | POA: Diagnosis not present

## 2018-06-03 DIAGNOSIS — J3089 Other allergic rhinitis: Secondary | ICD-10-CM | POA: Diagnosis not present

## 2018-06-10 DIAGNOSIS — J3089 Other allergic rhinitis: Secondary | ICD-10-CM | POA: Diagnosis not present

## 2018-06-22 DIAGNOSIS — J3089 Other allergic rhinitis: Secondary | ICD-10-CM | POA: Diagnosis not present

## 2018-07-01 DIAGNOSIS — J3089 Other allergic rhinitis: Secondary | ICD-10-CM | POA: Diagnosis not present

## 2018-07-12 DIAGNOSIS — J3089 Other allergic rhinitis: Secondary | ICD-10-CM | POA: Diagnosis not present

## 2018-07-23 DIAGNOSIS — J3089 Other allergic rhinitis: Secondary | ICD-10-CM | POA: Diagnosis not present

## 2018-08-19 DIAGNOSIS — H0102B Squamous blepharitis left eye, upper and lower eyelids: Secondary | ICD-10-CM | POA: Diagnosis not present

## 2018-08-19 DIAGNOSIS — H1045 Other chronic allergic conjunctivitis: Secondary | ICD-10-CM | POA: Diagnosis not present

## 2018-08-19 DIAGNOSIS — H0102A Squamous blepharitis right eye, upper and lower eyelids: Secondary | ICD-10-CM | POA: Diagnosis not present

## 2019-01-18 ENCOUNTER — Encounter (INDEPENDENT_AMBULATORY_CARE_PROVIDER_SITE_OTHER): Payer: Self-pay

## 2019-09-12 ENCOUNTER — Other Ambulatory Visit: Payer: Self-pay

## 2019-09-12 ENCOUNTER — Encounter (INDEPENDENT_AMBULATORY_CARE_PROVIDER_SITE_OTHER): Payer: Self-pay | Admitting: Otolaryngology

## 2019-09-12 ENCOUNTER — Ambulatory Visit (INDEPENDENT_AMBULATORY_CARE_PROVIDER_SITE_OTHER): Payer: BC Managed Care – PPO | Admitting: Otolaryngology

## 2019-09-12 VITALS — Temp 97.7°F

## 2019-09-12 DIAGNOSIS — H6121 Impacted cerumen, right ear: Secondary | ICD-10-CM | POA: Diagnosis not present

## 2019-09-12 DIAGNOSIS — H9312 Tinnitus, left ear: Secondary | ICD-10-CM | POA: Diagnosis not present

## 2019-09-12 NOTE — Progress Notes (Signed)
HPI: David Mercado is a 58 y.o. male who returns today for evaluation of ringing in his ears which is been worse over the last 2 to 3 months especially on the left side.  He does not notice any hearing problems.  He has been around a little bit of loud noise when he bass fishes in his motorboat.  But he has not noted any hearing problems.  Denies any trauma to the head or loud explosions..  Past Medical History:  Diagnosis Date  . Allergy   . Asthma   . Chronic headaches   . GERD (gastroesophageal reflux disease)   . Hyperlipidemia    Past Surgical History:  Procedure Laterality Date  . APPENDECTOMY    . FOOT SURGERY Right    Nerve surgery  . ROTATOR CUFF REPAIR    . ROTATOR CUFF REPAIR Bilateral   . torn acl knee     Social History   Socioeconomic History  . Marital status: Single    Spouse name: Not on file  . Number of children: 2  . Years of education: Not on file  . Highest education level: Not on file  Occupational History  . 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 Smoking status: Never Smoker  . Smokeless tobacco: Former Systems developer    Types: Snuff  . Tobacco comment: x 30 years off and on  Substance and Sexual Activity  . Alcohol use: Yes    Comment: very occasional  . Drug use: No  . Sexual activity: Not on file  Other Topics Concern  . Not on file  Social History Narrative  . Not on file   Social Determinants of Health   Financial Resource Strain:   . Difficulty of Paying Living Expenses:   Food Insecurity:   . Worried About Charity fundraiser in the Last Year:   . Arboriculturist in the Last Year:   Transportation Needs:   . Film/video editor (Medical):   Marland Kitchen Lack of Transportation (Non-Medical):   Physical Activity:   . Days of Exercise per Week:   . Minutes of Exercise per Session:   Stress:   . Feeling of Stress :   Social Connections:   . Frequency of Communication with Friends and Family:   . Frequency of Social Gatherings  with Friends and Family:   . Attends Religious Services:   . Active Member of Clubs or Organizations:   . Attends Archivist Meetings:   Marland Kitchen Marital Status:    Family History  Problem Relation Age of Onset  . Colon cancer Neg Hx   . Esophageal cancer Neg Hx   . Stomach cancer Neg Hx   . Rectal cancer Neg Hx    No Known Allergies Prior to Admission medications   Medication Sig Start Date End Date Taking? Authorizing Provider  albuterol (PROAIR HFA) 108 (90 BASE) MCG/ACT inhaler Inhale 2 puffs into the lungs every 6 (six) hours as needed.     Yes [provider]  EPIPEN 0.3 MG/0.3ML DEVI USE AS DIRECTED FOR SEVERE ALLERGIC REACTION 12/31/10  Yes Young, Clinton D, MD  ibuprofen (ADVIL,MOTRIN) 200 MG tablet Take 200 mg by mouth every 6 (six) hours as needed.     Yes [provider]  Multiple Vitamin (MULTIVITAMIN) capsule Take 1 capsule by mouth daily.     Yes [provider]  rosuvastatin (CRESTOR) 5 MG tablet Take 5 mg by mouth daily.  Yes [provider]     Positive ROS: Otherwise negative  All other systems have been reviewed and were otherwise negative with the exception of those mentioned in the HPI and as above.  Physical Exam: Constitutional: Alert, well-appearing, no acute distress Ears: External ears without lesions or tenderness.  On examination of the ear canals he had a earbud stuck in his right ear canal that was removed in the office today.  TMs were otherwise clear. Nasal: External nose without lesions. Clear nasal passages Oral: Lips and gums without lesions. Tongue and palate mucosa without lesions. Posterior oropharynx clear. Neck: No palpable adenopathy or masses Respiratory: Breathing comfortably  Skin: No facial/neck lesions or rash noted.  Cerumen impaction removal  Date/Time: 09/12/2019 4:58 PM Performed by: Rozetta Nunnery, MD Authorized by: Rozetta Nunnery, MD   Consent:    Consent obtained:   Verbal   Consent given by:  Patient   Risks discussed:  Pain and bleeding Procedure details:    Location:  R ear   Procedure type: forceps   Post-procedure details:    Inspection:  TM intact and canal normal   Hearing quality:  Improved   Patient tolerance of procedure:  Tolerated well, no immediate complications Comments:     An earbud with some wax was stuck in his right ear canal that was removed in the office with forceps and curette.  TMs were clear bilaterally.  Audiogram demonstrated borderline normal hearing in both ears with SRT's of 25 dB bilaterally.  Of note he did have a "noise notch" at 4000 frequency in the left ear only.  Type A tympanograms bilaterally.  Assessment: Ear canals were cleaned in the office today.  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 Concerning the tinnitus recommended using ear protection when around loud noise also briefly discussed with him concerning using masking noise when the tinnitus is bothersome.  Also gave him samples of Lipo flavonoid to try which is beneficial in some people.  Plan: Reviewed with him concerning limited treatment options for tinnitus and suggested the above.   Radene Journey, MD

## 2019-09-30 ENCOUNTER — Encounter (INDEPENDENT_AMBULATORY_CARE_PROVIDER_SITE_OTHER): Payer: Self-pay

## 2020-03-05 ENCOUNTER — Other Ambulatory Visit (INDEPENDENT_AMBULATORY_CARE_PROVIDER_SITE_OTHER): Payer: Self-pay

## 2020-03-05 MED ORDER — OMEPRAZOLE 40 MG PO CPDR: 40.0000 mg | DELAYED_RELEASE_CAPSULE | Freq: Every day | ORAL | 3 refills | Status: AC

## 2020-04-26 ENCOUNTER — Ambulatory Visit (INDEPENDENT_AMBULATORY_CARE_PROVIDER_SITE_OTHER): Payer: Managed Care, Other (non HMO) | Admitting: Otolaryngology

## 2020-04-26 ENCOUNTER — Other Ambulatory Visit: Payer: Self-pay

## 2020-04-26 ENCOUNTER — Encounter (INDEPENDENT_AMBULATORY_CARE_PROVIDER_SITE_OTHER): Payer: Self-pay | Admitting: Otolaryngology

## 2020-04-26 VITALS — Temp 100.9°F

## 2020-04-26 DIAGNOSIS — K219 Gastro-esophageal reflux disease without esophagitis: Secondary | ICD-10-CM

## 2020-04-26 NOTE — Progress Notes (Signed)
HPI: David Mercado is a 60 y.o. male who returns today for evaluation of throat symptoms he has had for 5 to 6 weeks.  He has had intermittent discomfort in his throat that varies from side to side.  It also varies from day-to-day.  He has previously been diagnosed with GERD and has used omeprazole in the past and has been using this recently which seems to help a little bit.  He does dip tobacco. He has had no hoarseness.  He has had no difficulty eating or swallowing..  Past Medical History:  Diagnosis Date  . Allergy   . Asthma   . Chronic headaches   . GERD (gastroesophageal reflux disease)   . Hyperlipidemia    Past Surgical History:  Procedure Laterality Date  . APPENDECTOMY    . FOOT SURGERY Right    Nerve surgery  . ROTATOR CUFF REPAIR    . ROTATOR CUFF REPAIR Bilateral   . torn acl knee     Social History   Socioeconomic History  . Marital status: Single    Spouse name: Not on file  . Number of children: 2  . Years of education: Not on file  . Highest education level: Not on file  Occupational History  . 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 Smoking status: Never Smoker  . Smokeless tobacco: Current User    Types: Snuff  . Tobacco comment: x 30 years off and on  Substance and Sexual Activity  . Alcohol use: Yes    Comment: very occasional  . Drug use: No  . Sexual activity: Not on file  Other Topics Concern  . Not on file  Social History Narrative  . Not on file   Social Determinants of Health   Financial Resource Strain: Not on file  Food Insecurity: Not on file  Transportation Needs: Not on file  Physical Activity: Not on file  Stress: Not on file  Social Connections: Not on file   Family History  Problem Relation Age of Onset  . Colon cancer Neg Hx   . Esophageal cancer Neg Hx   . Stomach cancer Neg Hx   . Rectal cancer Neg Hx    No Known Allergies Prior to Admission medications   Medication Sig Start Date End Date Taking?  Authorizing Provider  albuterol (PROAIR HFA) 108 (90 BASE) MCG/ACT inhaler Inhale 2 puffs into the lungs every 6 (six) hours as needed.      [provider]  EPIPEN 0.3 MG/0.3ML DEVI USE AS DIRECTED FOR SEVERE ALLERGIC REACTION 12/31/10   Baird Lyons D, MD  ibuprofen (ADVIL,MOTRIN) 200 MG tablet Take 200 mg by mouth every 6 (six) hours as needed.      [provider]  Multiple Vitamin (MULTIVITAMIN) capsule Take 1 capsule by mouth daily.      [provider]  omeprazole (PRILOSEC) 40 MG capsule Take 1 capsule (40 mg total) by mouth daily. Take daily before dinner. 03/05/20   Rozetta Nunnery, MD  rosuvastatin (CRESTOR) 5 MG tablet Take 5 mg by mouth daily.    [provider]     Positive ROS: Otherwise negative  All other systems have been reviewed and were otherwise negative with the exception of those mentioned in the HPI and as above.  Physical Exam: Constitutional: Alert, well-appearing, no acute distress Ears: External ears without lesions or tenderness. Ear canals are clear bilaterally with intact, clear TMs.  Nasal: External nose without lesions.  Septum with minimal deformity. Clear nasal passages bilaterally with no signs of infection. Oral: Lips and gums without lesions. Tongue and palate mucosa without lesions. Posterior oropharynx clear.  Evaluation of the buccal alveolar groove on either side revealed normal-appearing mucosa.  Tonsil regions appear benign bilaterally.  Indirect laryngoscopy revealed a clear base of tongue vallecula epiglottis.  Vocal cords were normal bilaterally.  Piriform sinuses were clear.  Palpation of the base of tongue was soft. Neck: No palpable adenopathy or masses.  No palpable adenopathy along the jugular chain of lymph nodes on either side. Respiratory: Breathing comfortably  Skin: No facial/neck lesions or rash noted.  Procedures  Assessment: Mild throat discomfort most likely related to laryngeal  pharyngeal reflux disease. No signs of infection or neoplasia.  Plan: Reassured patient of normal upper airway examination with no signs of infection or neoplasia. I did recommend stopping use of tobacco. I refilled his prescription for omeprazole 40 mg daily before dinner. He will follow-up if symptoms worsen or change.   Radene Journey, MD

## 2022-04-25 DIAGNOSIS — E785 Hyperlipidemia, unspecified: Secondary | ICD-10-CM | POA: Diagnosis not present

## 2022-04-25 DIAGNOSIS — K219 Gastro-esophageal reflux disease without esophagitis: Secondary | ICD-10-CM | POA: Diagnosis not present

## 2022-04-25 DIAGNOSIS — R7989 Other specified abnormal findings of blood chemistry: Secondary | ICD-10-CM | POA: Diagnosis not present

## 2022-04-25 DIAGNOSIS — Z125 Encounter for screening for malignant neoplasm of prostate: Secondary | ICD-10-CM | POA: Diagnosis not present

## 2022-05-02 DIAGNOSIS — R82998 Other abnormal findings in urine: Secondary | ICD-10-CM | POA: Diagnosis not present

## 2022-05-15 DIAGNOSIS — Z7689 Persons encountering health services in other specified circumstances: Secondary | ICD-10-CM | POA: Diagnosis not present

## 2022-09-01 DIAGNOSIS — D225 Melanocytic nevi of trunk: Secondary | ICD-10-CM | POA: Diagnosis not present

## 2022-09-01 DIAGNOSIS — L570 Actinic keratosis: Secondary | ICD-10-CM | POA: Diagnosis not present

## 2022-09-01 DIAGNOSIS — L814 Other melanin hyperpigmentation: Secondary | ICD-10-CM | POA: Diagnosis not present

## 2022-09-01 DIAGNOSIS — L821 Other seborrheic keratosis: Secondary | ICD-10-CM | POA: Diagnosis not present

## 2022-09-01 DIAGNOSIS — L578 Other skin changes due to chronic exposure to nonionizing radiation: Secondary | ICD-10-CM | POA: Diagnosis not present

## 2022-10-13 DIAGNOSIS — M79645 Pain in left finger(s): Secondary | ICD-10-CM | POA: Diagnosis not present

## 2022-10-13 DIAGNOSIS — M65332 Trigger finger, left middle finger: Secondary | ICD-10-CM | POA: Diagnosis not present

## 2022-11-03 DIAGNOSIS — M65342 Trigger finger, left ring finger: Secondary | ICD-10-CM | POA: Diagnosis not present

## 2023-02-17 DIAGNOSIS — R972 Elevated prostate specific antigen [PSA]: Secondary | ICD-10-CM | POA: Diagnosis not present

## 2023-02-17 DIAGNOSIS — E785 Hyperlipidemia, unspecified: Secondary | ICD-10-CM | POA: Diagnosis not present

## 2023-02-17 DIAGNOSIS — Z79899 Other long term (current) drug therapy: Secondary | ICD-10-CM | POA: Diagnosis not present

## 2023-04-10 DIAGNOSIS — R972 Elevated prostate specific antigen [PSA]: Secondary | ICD-10-CM | POA: Diagnosis not present

## 2023-05-25 DIAGNOSIS — E785 Hyperlipidemia, unspecified: Secondary | ICD-10-CM | POA: Diagnosis not present

## 2023-05-25 DIAGNOSIS — R972 Elevated prostate specific antigen [PSA]: Secondary | ICD-10-CM | POA: Diagnosis not present

## 2023-05-31 DIAGNOSIS — Z1212 Encounter for screening for malignant neoplasm of rectum: Secondary | ICD-10-CM | POA: Diagnosis not present

## 2023-06-01 DIAGNOSIS — Z818 Family history of other mental and behavioral disorders: Secondary | ICD-10-CM | POA: Diagnosis not present

## 2023-06-01 DIAGNOSIS — Z0000 Encounter for general adult medical examination without abnormal findings: Secondary | ICD-10-CM | POA: Diagnosis not present

## 2023-06-01 DIAGNOSIS — R972 Elevated prostate specific antigen [PSA]: Secondary | ICD-10-CM | POA: Diagnosis not present

## 2023-06-01 DIAGNOSIS — M1990 Unspecified osteoarthritis, unspecified site: Secondary | ICD-10-CM | POA: Diagnosis not present

## 2023-06-01 DIAGNOSIS — R82998 Other abnormal findings in urine: Secondary | ICD-10-CM | POA: Diagnosis not present

## 2023-06-01 DIAGNOSIS — Z1339 Encounter for screening examination for other mental health and behavioral disorders: Secondary | ICD-10-CM | POA: Diagnosis not present

## 2023-06-01 DIAGNOSIS — J302 Other seasonal allergic rhinitis: Secondary | ICD-10-CM | POA: Diagnosis not present

## 2023-06-01 DIAGNOSIS — R202 Paresthesia of skin: Secondary | ICD-10-CM | POA: Diagnosis not present

## 2023-06-01 DIAGNOSIS — Z1331 Encounter for screening for depression: Secondary | ICD-10-CM | POA: Diagnosis not present

## 2023-06-01 DIAGNOSIS — E785 Hyperlipidemia, unspecified: Secondary | ICD-10-CM | POA: Diagnosis not present

## 2023-06-01 DIAGNOSIS — J45901 Unspecified asthma with (acute) exacerbation: Secondary | ICD-10-CM | POA: Diagnosis not present

## 2023-12-18 DIAGNOSIS — R972 Elevated prostate specific antigen [PSA]: Secondary | ICD-10-CM | POA: Diagnosis not present

## 2024-02-15 DIAGNOSIS — M25552 Pain in left hip: Secondary | ICD-10-CM | POA: Diagnosis not present
